# Patient Record
Sex: Female | Born: 1955 | Race: Black or African American | Hispanic: No | Marital: Married | Smoking: Never smoker
Health system: Southern US, Community
[De-identification: ages and names within clinical notes are randomized; demographics above are authoritative.]

## PROBLEM LIST (undated history)

## (undated) DIAGNOSIS — T7840XA Allergy, unspecified, initial encounter: Secondary | ICD-10-CM

## (undated) DIAGNOSIS — G8929 Other chronic pain: Secondary | ICD-10-CM

## (undated) DIAGNOSIS — M545 Low back pain, unspecified: Secondary | ICD-10-CM

## (undated) DIAGNOSIS — M1612 Unilateral primary osteoarthritis, left hip: Secondary | ICD-10-CM

## (undated) DIAGNOSIS — U071 COVID-19: Secondary | ICD-10-CM

## (undated) DIAGNOSIS — F419 Anxiety disorder, unspecified: Secondary | ICD-10-CM

## (undated) DIAGNOSIS — M1990 Unspecified osteoarthritis, unspecified site: Secondary | ICD-10-CM

## (undated) DIAGNOSIS — I10 Essential (primary) hypertension: Secondary | ICD-10-CM

## (undated) HISTORY — DX: Unspecified osteoarthritis, unspecified site: M19.90

## (undated) HISTORY — DX: Allergy, unspecified, initial encounter: T78.40XA

## (undated) HISTORY — DX: Other chronic pain: G89.29

## (undated) HISTORY — DX: COVID-19: U07.1

## (undated) HISTORY — DX: Low back pain, unspecified: M54.50

## (undated) HISTORY — PX: OTHER SURGICAL HISTORY: SHX169

## (undated) HISTORY — DX: Anxiety disorder, unspecified: F41.9

## (undated) HISTORY — DX: Unilateral primary osteoarthritis, left hip: M16.12

## (undated) HISTORY — DX: Low back pain: M54.5

## (undated) HISTORY — DX: Essential (primary) hypertension: I10

---

## 1998-03-21 ENCOUNTER — Other Ambulatory Visit: Admission: RE | Admit: 1998-03-21 | Discharge: 1998-03-21 | Payer: Self-pay | Admitting: Obstetrics and Gynecology

## 1998-07-05 ENCOUNTER — Ambulatory Visit (HOSPITAL_COMMUNITY): Admission: RE | Admit: 1998-07-05 | Discharge: 1998-07-05 | Payer: Self-pay | Admitting: Obstetrics and Gynecology

## 1999-10-08 ENCOUNTER — Emergency Department (HOSPITAL_COMMUNITY): Admission: EM | Admit: 1999-10-08 | Discharge: 1999-10-08 | Payer: Self-pay

## 2013-03-20 ENCOUNTER — Ambulatory Visit (INDEPENDENT_AMBULATORY_CARE_PROVIDER_SITE_OTHER): Payer: 59 | Admitting: Internal Medicine

## 2013-03-20 VITALS — BP 128/60 | HR 68 | Temp 98.4°F | Resp 16 | Ht 66.5 in | Wt 166.0 lb

## 2013-03-20 DIAGNOSIS — R05 Cough: Secondary | ICD-10-CM

## 2013-03-20 DIAGNOSIS — J019 Acute sinusitis, unspecified: Secondary | ICD-10-CM

## 2013-03-20 DIAGNOSIS — R059 Cough, unspecified: Secondary | ICD-10-CM

## 2013-03-20 MED ORDER — HYDROCODONE-HOMATROPINE 5-1.5 MG/5ML PO SYRP: 5.0000 mL | ORAL_SOLUTION | Freq: Four times a day (QID) | ORAL | Status: DC | PRN

## 2013-03-20 MED ORDER — AMOXICILLIN-POT CLAVULANATE 875-125 MG PO TABS: 1.0000 | ORAL_TABLET | Freq: Two times a day (BID) | ORAL | Status: DC

## 2013-03-20 NOTE — Progress Notes (Signed)
This chart was scribed for Tami Lin, MD by Einar Pheasant, ED Scribe. This patient was seen in room 11 and the patient's care was started at 8:40 PM. Subjective:    Patient ID: Runell Gess, female    DOB: 12/29/1955, 58 y.o.   MRN: [id number]  HPI HPI Comments: VALERY AMEDEE is a 58 y.o. female who presents to the [facility] complaining of a sore throat that started 3 days ago. Pt is also complaining of associated headache, cough, chills, rhinorrhea, congestion, and voice changes. She states that she recently regained her normal voice. At night, she states that she is able to breathe. Pt states that she feels like she doesn't feel like doing anything and she is tiered all the time. She reports taking many OTC medications with not relief. Pt reports taking 500mg  of Amoxicillin for 3 days. Denies any fever, SOB, or chest pain.   There are no active problems to display for this patient.  Past Medical History  Diagnosis Date  . Allergy   . Anxiety   . Arthritis    History reviewed. No pertinent past surgical history. Not on File Prior to Admission medications   Not on File   History   Social History  . Marital Status: Married    Spouse Name: N/A    Number of Children: N/A  . Years of Education: N/A   Occupational History  . Not on file.   Social History Main Topics  . Smoking status: Never Smoker   . Smokeless tobacco: Not on file  . Alcohol Use: No  . Drug Use: No  . Sexual Activity: Not on file   Other Topics Concern  . Not on file   Social History Narrative  . No narrative on file     Review of Systems As per HPI and PMH    Objective:   Physical Exam  Nursing note and vitals reviewed. Constitutional: She is oriented to person, place, and time. She appears well-developed and well-nourished. No distress.  HENT:  Head: Normocephalic and atraumatic.  Right Ear: External ear normal.  Left Ear: External ear normal.  Nose:  Nose normal.  Mouth/Throat: Oropharynx is clear and moist. No oropharyngeal exudate.  Nose has purulent discharge.  Eyes: Conjunctivae are normal. Right eye exhibits no discharge. Left eye exhibits no discharge.  Neck: Neck supple. No thyromegaly present.  Cardiovascular: Normal rate, regular rhythm and normal heart sounds.  Exam reveals no gallop and no friction rub.   No murmur heard. Pulmonary/Chest: Effort normal and breath sounds normal. No respiratory distress. She has no wheezes. She has no rales.  Abdominal: Soft. Bowel sounds are normal. She exhibits no distension and no mass. There is no tenderness. There is no rebound.  Musculoskeletal: She exhibits no edema and no tenderness.  Lymphadenopathy:    She has no cervical adenopathy.  Neurological: She is alert and oriented to person, place, and time.  Skin: Skin is warm and dry.  Psychiatric: She has a normal mood and affect. Her behavior is normal. Thought content normal.    Filed Vitals:   03/20/13 2035  BP: 128/60  Pulse: 68  Temp: 98.4 F (36.9 C)  TempSrc: Oral  Resp: 16  Height: 5' 6.5" (1.689 m)  Weight: 166 lb (75.297 kg)  SpO2: 98%          Assessment & Plan:    I have completed the patient encounter in its entirety as documented by the scribe,  with editing by me where necessary. Sulamita Lafountain P. Laney Pastor, M.D. Acute sinusitis, unspecified  Cough  Meds ordered this encounter  Medications  . amoxicillin-clavulanate (AUGMENTIN) 875-125 MG per tablet    Sig: Take 1 tablet by mouth 2 (two) times daily.    Dispense:  20 tablet    Refill:  0  . HYDROcodone-homatropine (HYCODAN) 5-1.5 MG/5ML syrup    Sig: Take 5 mLs by mouth every 6 (six) hours as needed for cough.    Dispense:  120 mL    Refill:  0

## 2013-10-30 ENCOUNTER — Ambulatory Visit (INDEPENDENT_AMBULATORY_CARE_PROVIDER_SITE_OTHER): Payer: 59 | Admitting: Family Medicine

## 2013-10-30 VITALS — BP 174/80 | HR 71 | Temp 98.7°F | Resp 18 | Wt 165.0 lb

## 2013-10-30 DIAGNOSIS — M7122 Synovial cyst of popliteal space [Baker], left knee: Secondary | ICD-10-CM

## 2013-10-30 DIAGNOSIS — J209 Acute bronchitis, unspecified: Secondary | ICD-10-CM

## 2013-10-30 DIAGNOSIS — L239 Allergic contact dermatitis, unspecified cause: Secondary | ICD-10-CM

## 2013-10-30 MED ORDER — TRIAMCINOLONE ACETONIDE 0.1 % EX CREA: 1.0000 "application " | TOPICAL_CREAM | Freq: Two times a day (BID) | CUTANEOUS | Status: DC

## 2013-10-30 MED ORDER — CEPHALEXIN 500 MG PO CAPS: 500.0000 mg | ORAL_CAPSULE | Freq: Four times a day (QID) | ORAL | Status: DC

## 2013-10-30 MED ORDER — MUPIROCIN 2 % EX OINT
1.0000 | TOPICAL_OINTMENT | Freq: Two times a day (BID) | CUTANEOUS | Status: DC
Start: 2013-10-30 — End: 2013-12-12

## 2013-10-30 MED ORDER — HYDROCODONE-HOMATROPINE 5-1.5 MG/5ML PO SYRP: 5.0000 mL | ORAL_SOLUTION | Freq: Four times a day (QID) | ORAL | Status: DC | PRN

## 2013-10-30 NOTE — Progress Notes (Signed)
Subjective:  This chart was scribed for Delman Cheadle, MD, by Starleen Arms, ED Scribe. This patient was seen in room 8 and the patient's care was started at 7:35 PM.   Patient ID: Charlene Cox, female    DOB: 04/20/1955, 58 y.o.   MRN: [id number] Chief Complaint  Patient presents with  . Sore Throat  . Cough  . Hand Pain    history of laceration  . Leg Pain    swelling/pain behind left knee    HPI HPI Comments: Charlene Cox is a 58 y.o. female who presents to the Emergency Department complaining of cough productive of green sputum with associated congestion, generalized fatigue, and sore throat onset 7 days ago.  She reports that the sore throat has resolved but her other symptoms have persisted with some improvement.  Patient reports that she has used multiple OTC medications - most recently today - but cannot recall the names.  Patient denies history of smoking, asthma.  Patient denies weakness, fever, chills, SOB, wheezing, CP.    Patient also complains of a laceration on the dorsal surface of her right thumb that has become mildly swollen and discolored recently.  She has treated with a tea tree oil and neosporin.    Patient also complains of an area of hyperpigmentation and soreness on her posterior left knee joint.   Patient denies having an at-home blood pressure cuff.  She reports that her BP is usually within a normal range, but she has been under a significant amount of stress lately.     Current Outpatient Prescriptions on File Prior to Visit  Medication Sig Dispense Refill  . amoxicillin-clavulanate (AUGMENTIN) 875-125 MG per tablet Take 1 tablet by mouth 2 (two) times daily.  20 tablet  0  . HYDROcodone-homatropine (HYCODAN) 5-1.5 MG/5ML syrup Take 5 mLs by mouth every 6 (six) hours as needed for cough.  120 mL  0   No current facility-administered medications on file prior to visit.   No Known Allergies Past Medical History  Diagnosis Date  . Allergy   .  Anxiety   . Arthritis       Review of Systems  Constitutional: Negative for fever and chills.  HENT: Positive for congestion and sore throat.   Respiratory: Positive for cough. Negative for shortness of breath and wheezing.   Cardiovascular: Negative for chest pain.  Neurological: Negative for weakness.       Objective:  BP 174/80  Pulse 71  Temp(Src) 98.7 F (37.1 C) (Oral)  Resp 18  Wt 165 lb (74.844 kg)  SpO2 98%  Physical Exam  Nursing note and vitals reviewed. Constitutional: She is oriented to person, place, and time. She appears well-developed and well-nourished. No distress.  HENT:  Head: Normocephalic and atraumatic.  Erythema and exudate on the oropharynx but no edema.  TM's normal.  Nasopharynx normal.   Eyes: Conjunctivae and EOM are normal.  Neck: Neck supple. No tracheal deviation present.  Cardiovascular: Normal rate, regular rhythm and normal heart sounds.   Pulmonary/Chest: Effort normal and breath sounds normal. No respiratory distress. She has no wheezes. She has no rales.  Musculoskeletal: Normal range of motion.  Very poorly defined, very subtle hyperpigmented macular area on left popliteal fossa with fullness palpable.  No edema in lower extremity or calf tenderness.  Knee otherwise appears normal.   Neurological: She is alert and oriented to person, place, and time.  Skin: Skin is warm and dry.  Thenar eminence and first phalanx of  right hand palmar aspect had thickened, yellow, cracked, scaling skin approximately 2 in by 3/4 in diameter on erythematous base.  No odor or exudate from crack.   Second small area around 5th MCP on palmar aspect also noted.   Psychiatric: She has a normal mood and affect. Her behavior is normal.          Assessment & Plan:  7:43 PM Discussed plan to prescribe oral antibiotic as well as additional topical antibiotic for left thumb.  Informed patient that a cyst is causing the discomfort in her left knee.  Acute  bronchitis, unspecified organism  Atopic contact dermatitis with secondary bacterial infection - start keflex and alternate topical triamcinolone w/ mupirocin  Baker cyst, left - RICE - if develops pain or worsens, rec [place]  Meds ordered this encounter  Medications  . cephALEXin (KEFLEX) 500 MG capsule    Sig: Take 1 capsule (500 mg total) by mouth 4 (four) times daily.    Dispense:  28 capsule    Refill:  0  . HYDROcodone-homatropine (HYCODAN) 5-1.5 MG/5ML syrup    Sig: Take 5 mLs by mouth every 6 (six) hours as needed for cough.    Dispense:  120 mL    Refill:  0  . mupirocin ointment (BACTROBAN) 2 %    Sig: Apply 1 application topically 2 (two) times daily.    Dispense:  30 g    Refill:  0  . triamcinolone cream (KENALOG) 0.1 %    Sig: Apply 1 application topically 2 (two) times daily.    Dispense:  30 g    Refill:  0  . DISCONTD: Alum & Mag Hydroxide-Simeth (MAGIC MOUTHWASH W/LIDOCAINE) SOLN    Sig: Take 10 mLs by mouth every 2 (two) hours as needed for mouth pain.    Dispense:  360 mL    Refill:  0    Ok to use pharmacy formulary; mix in 1:1 ratio with viscous lidocaine, swish and spit  . Alum & Mag Hydroxide-Simeth (MAGIC MOUTHWASH W/LIDOCAINE) SOLN    Sig: Take 10 mLs by mouth every 2 (two) hours as needed for mouth pain.    Dispense:  360 mL    Refill:  0    Ok to use pharmacy formulary; mix in 1:1 ratio with viscous lidocaine, swish and spit    I personally performed the services described in this documentation, which was scribed in my presence. The recorded information has been reviewed and considered, and addended by me as needed.  Delman Cheadle, MD MPH

## 2013-10-30 NOTE — Patient Instructions (Signed)
Alternate the mupirocin (topical antibiotic) with the triamcinolone to your hand (steroid) General skin care measures aimed at reducing skin irritation and restoring the skin barrier include:  Using lukewarm water and soap-free cleansers to wash hands  Drying hands thoroughly after washing  Applying emollients (eg, petroleum jelly) immediately after hand drying and as often as possible  Wearing cotton gloves under vinyl or other nonlatex gloves when performing wet work  Removing rings and watches and bracelets before wet work  Wearing protective gloves in cold weather  Wearing task-specific gloves for frictional exposures (eg, gardening, carpentry)  Avoiding exposure to irritants (eg, detergents, solvents, hair lotions or dyes, acidic foods [eg, citrus fruit])     Acute Bronchitis Bronchitis is inflammation of the airways that extend from the windpipe into the lungs (bronchi). The inflammation often causes mucus to develop. This leads to a cough, which is the most common symptom of bronchitis.  In acute bronchitis, the condition usually develops suddenly and goes away over time, usually in a couple weeks. Smoking, allergies, and asthma can make bronchitis worse. Repeated episodes of bronchitis may cause further lung problems.  CAUSES Acute bronchitis is most often caused by the same virus that causes a cold. The virus can spread from person to person (contagious) through coughing, sneezing, and touching contaminated objects. SIGNS AND SYMPTOMS   Cough.   Fever.   Coughing up mucus.   Body aches.   Chest congestion.   Chills.   Shortness of breath.   Sore throat.  DIAGNOSIS  Acute bronchitis is usually diagnosed through a physical exam. Your health care provider will also ask you questions about your medical history. Tests, such as chest X-rays, are sometimes done to rule out other conditions.  TREATMENT  Acute bronchitis usually goes away in a couple weeks. Oftentimes,  no medical treatment is necessary. Medicines are sometimes given for relief of fever or cough. Antibiotic medicines are usually not needed but may be prescribed in certain situations. In some cases, an inhaler may be recommended to help reduce shortness of breath and control the cough. A cool mist vaporizer may also be used to help thin bronchial secretions and make it easier to clear the chest.  HOME CARE INSTRUCTIONS  Get plenty of rest.   Drink enough fluids to keep your urine clear or pale yellow (unless you have a medical condition that requires fluid restriction). Increasing fluids may help thin your respiratory secretions (sputum) and reduce chest congestion, and it will prevent dehydration.   Take medicines only as directed by your health care provider.  If you were prescribed an antibiotic medicine, finish it all even if you start to feel better.  Avoid smoking and secondhand smoke. Exposure to cigarette smoke or irritating chemicals will make bronchitis worse. If you are a smoker, consider using nicotine gum or skin patches to help control withdrawal symptoms. Quitting smoking will help your lungs heal faster.   Reduce the chances of another bout of acute bronchitis by washing your hands frequently, avoiding people with cold symptoms, and trying not to touch your hands to your mouth, nose, or eyes.   Keep all follow-up visits as directed by your health care provider.  SEEK MEDICAL CARE IF: Your symptoms do not improve after 1 week of treatment.  SEEK IMMEDIATE MEDICAL CARE IF:  You develop an increased fever or chills.   You have chest pain.   You have severe shortness of breath.  You have bloody sputum.   You develop dehydration.  You faint or repeatedly feel like you are going to pass out.  You develop repeated vomiting.  You develop a severe headache. MAKE SURE YOU:   Understand these instructions.  Will watch your condition.  Will get help right away if  you are not doing well or get worse. Document Released: 02/06/2004 Document Revised: 05/15/2013 Document Reviewed: 06/21/2012 [facility] Patient Information 2015 [place], [place]. This information is not intended to replace advice given to you by your health care provider. Make sure you discuss any questions you have with your health care provider. Baker Cyst A Baker cyst is a sac-like structure that forms in the back of the knee. It is filled with the same fluid that is located in your knee. This fluid lubricates the bones and cartilage of the knee and allows them to move over each other more easily. CAUSES  When the knee becomes injured or inflamed, increased fluid forms in the knee. When this happens, the joint lining is pushed out behind the knee and forms the Baker cyst. This cyst may also be caused by inflammation from arthritic conditions and infections. SIGNS AND SYMPTOMS  A Baker cyst usually has no symptoms. When the cyst is substantially enlarged:  You may feel pressure behind the knee, stiffness in the knee, or a mass in the area behind the knee.  You may develop pain, redness, and swelling in the calf. This can suggest a blood clot and requires evaluation by your health care provider. DIAGNOSIS  A Baker cyst is most often found during an ultrasound exam. This exam may have been performed for other reasons, and the cyst was found incidentally. Sometimes an MRI is used. This picks up other problems within a joint that an ultrasound exam may not. If the Baker cyst developed immediately after an injury, X-ray exams may be used to diagnose the cyst. TREATMENT  The treatment depends on the cause of the cyst. Anti-inflammatory medicines and rest often will be prescribed. If the cyst is caused by a bacterial infection, antibiotic medicines may be prescribed.  HOME CARE INSTRUCTIONS   If the cyst was caused by an injury, for the first 24 hours, keep the injured leg elevated on 2 pillows while  lying down.  For the first 24 hours while you are awake, apply ice to the injured area:  Put ice in a plastic bag.  Place a towel between your skin and the bag.  Leave the ice on for 20 minutes, 2-3 times a day.  Only take over-the-counter or prescription medicines for pain, discomfort, or fever as directed by your health care provider.  Only take antibiotic medicine as directed. Make sure to finish it even if you start to feel better. MAKE SURE YOU:   Understand these instructions.  Will watch your condition.  Will get help right away if you are not doing well or get worse. Document Released: 12/29/2004 Document Revised: 10/19/2012 Document Reviewed: 08/10/2012 [facility] Patient Information 2015 [place], [place]. This information is not intended to replace advice given to you by your health care provider. Make sure you discuss any questions you have with your health care provider.

## 2013-11-03 ENCOUNTER — Telehealth: Payer: Self-pay

## 2013-11-03 MED ORDER — MAGIC MOUTHWASH W/LIDOCAINE: 10.0000 mL | ORAL | Status: DC | PRN

## 2013-11-03 NOTE — Telephone Encounter (Signed)
Was sent in to [facility] and normally they will go ahead and fill it - please call [facility] and cancel and then ok to resend rx in to [place].  Could be trouble if [facility] already filled it and then if they charge her insurance sometimes the second place won't fill it or charge her more. . . .

## 2013-11-03 NOTE — Telephone Encounter (Signed)
cxl'd rx at [facility]

## 2013-11-03 NOTE — Telephone Encounter (Signed)
Patient calling again to get the magic mouthwash called into her pharamcy [facility]. Closer to her home. Please do so.   She can be reached at [phone].

## 2013-11-03 NOTE — Telephone Encounter (Signed)
Dr. Brigitte Pulse:  Patient saw you Monday and since then her throat is still pretty sore.  Can we call her in some prescription lozenges or medicine to [facility] on [address].  She can be reached at [phone].

## 2013-11-09 ENCOUNTER — Telehealth: Payer: Self-pay

## 2013-11-09 NOTE — Telephone Encounter (Signed)
Patient called again requesting refill on the cough syrup. Please return call and advise. States she still has a cough.

## 2013-11-09 NOTE — Telephone Encounter (Signed)
Patient would like another round of HYDROcodone-homatropine (HYCODAN) 5-1.5 MG/5ML syrup Still has the cough.  Saw Dr. Brigitte Pulse Changing pharmacy to [facility]    [phone]

## 2013-11-10 MED ORDER — HYDROCODONE-HOMATROPINE 5-1.5 MG/5ML PO SYRP: 5.0000 mL | ORAL_SOLUTION | Freq: Three times a day (TID) | ORAL | Status: DC | PRN

## 2013-11-10 NOTE — Telephone Encounter (Signed)
Refilled x 1 -rx has to be picked up since controlled - needs OV if cough continues

## 2013-11-11 NOTE — Telephone Encounter (Signed)
I am unable to find this prescription. Do you know where it is?

## 2013-11-11 NOTE — Telephone Encounter (Signed)
It printed out at [facility] and I asked the staff to have someone bring it over to [facility] for pick up. If it can't be located, please ask PA to refill. thx

## 2013-11-12 NOTE — Telephone Encounter (Signed)
Reprinted Rx.

## 2013-11-13 NOTE — Telephone Encounter (Signed)
Script is at [facility] to be picked up.

## 2013-11-13 NOTE — Telephone Encounter (Signed)
Lm to advise pt. 

## 2013-12-12 ENCOUNTER — Ambulatory Visit (INDEPENDENT_AMBULATORY_CARE_PROVIDER_SITE_OTHER): Payer: 59 | Admitting: Sports Medicine

## 2013-12-12 VITALS — BP 152/90 | HR 67 | Temp 98.1°F | Resp 16 | Ht 66.5 in | Wt 164.1 lb

## 2013-12-12 DIAGNOSIS — R030 Elevated blood-pressure reading, without diagnosis of hypertension: Secondary | ICD-10-CM

## 2013-12-12 DIAGNOSIS — IMO0001 Reserved for inherently not codable concepts without codable children: Secondary | ICD-10-CM

## 2013-12-12 DIAGNOSIS — B350 Tinea barbae and tinea capitis: Secondary | ICD-10-CM

## 2013-12-12 MED ORDER — SELENIUM SULFIDE 2.25 % EX SHAM: 1.0000 "application " | MEDICATED_SHAMPOO | Freq: Every day | CUTANEOUS | Status: DC

## 2013-12-12 MED ORDER — SELENIUM SULFIDE 2.25 % EX SHAM
1.0000 | MEDICATED_SHAMPOO | Freq: Every day | CUTANEOUS | Status: DC
Start: 2013-12-12 — End: 2015-11-04

## 2013-12-12 NOTE — Patient Instructions (Signed)

## 2013-12-12 NOTE — Progress Notes (Signed)
  Charlene Cox - 58 y.o. female MRN [id number]  Date of birth: [birth year]  CC & HPI:  Charlene Cox is here for evaluation of: Scalp rash: Patient reports rash over entire occipital region of her scalp over the past month. She reports this appeared following changing barbers and using new hair product. She has had itching and has been excoriating the region. She had a lump appear on the posterior aspect of her neck yesterday and reported it to be approximately the size of a golf ball. It has decreased in size after taking a leftover 20 mg prednisone yesterday. She denies any fevers, chills, night sweats. No recent weight loss. She's not been using other topical medications or any other interventions at this time.   ROS:  Per HPI.   HISTORY: Past Medical, Surgical, Social, and Family History Reviewed & Updated per EMR.  Pertinent Historical Findings include: Otherwise Healthy  Non Smoker No chronic medications  OBJECTIVE:  VS:   HT:5' 6.5" (168.9 cm)   WT:164 lb 2 oz (74.447 kg)  BMI:26.1          BP:(!) 152/90 mmHg  HR:67bpm  TEMP:98.1 F (36.7 C)(Oral)  RESP:100 %  PHYSICAL EXAM: GENERAL:  adult African-American female. In no discomfort; no respiratory distress   PSYCH: alert and appropriate, good insight   HNEENT: mmm, no JVD Significant satellite lesions spread throughout the scalp with secondary excoriations but no surrounding erythema or loculations. There is a 1 cm mobile, mildly tender lymph node in the left posterior occipital region. No anterior or posterior cervical chain lymphadenopathy.   Skin:  she has lesions over the right thenar eminence as well as the left antebrachial crease that is consistent with tinea corpis  EXTREM: Warm, well perfused.  Moves all 4 extremities spontaneously; no lateralization. No pretibial edema.   ASSESSMENT: 1. Tinea capitis   2. Elevated blood pressure    Likely secondary to exposure at [place]. She does have other skin lesions consistent  with tinea infection does not appear to be significantly deep and will try to avoid systemic therapy at this time this may be warranted if not improving.  PLAN: See problem based charting & AVS for additional documentation.  Topical slam sulfide daily for 1 week. If not improved may need systemic therapy. I would not be surprised if she also had an acute flair in the next 1-2 days due to the steroid use but hopefully this will improve and she will recover uneventfully.   Avoid further steroids at this time.  Continue to monitor blood pressure, patient took 20 mg tablet of prednisone yesterday which may be contributing. Needs to be followed > Return if symptoms worsen or fail to improve.

## 2015-08-30 ENCOUNTER — Ambulatory Visit
Admission: RE | Admit: 2015-08-30 | Discharge: 2015-08-30 | Disposition: A | Discharge status: Home / Self Care | Payer: BLUE CROSS/BLUE SHIELD | Source: Ambulatory Visit | Attending: Internal Medicine | Admitting: Internal Medicine

## 2015-08-30 ENCOUNTER — Other Ambulatory Visit: Payer: Self-pay | Admitting: Internal Medicine

## 2015-08-30 DIAGNOSIS — Z1231 Encounter for screening mammogram for malignant neoplasm of breast: Secondary | ICD-10-CM

## 2015-11-04 ENCOUNTER — Ambulatory Visit (INDEPENDENT_AMBULATORY_CARE_PROVIDER_SITE_OTHER): Payer: BLUE CROSS/BLUE SHIELD | Admitting: Urgent Care

## 2015-11-04 ENCOUNTER — Ambulatory Visit (INDEPENDENT_AMBULATORY_CARE_PROVIDER_SITE_OTHER): Payer: BLUE CROSS/BLUE SHIELD

## 2015-11-04 VITALS — BP 148/96 | HR 84 | Temp 98.5°F | Resp 17 | Ht 66.5 in | Wt 180.0 lb

## 2015-11-04 DIAGNOSIS — R0989 Other specified symptoms and signs involving the circulatory and respiratory systems: Secondary | ICD-10-CM

## 2015-11-04 DIAGNOSIS — J302 Other seasonal allergic rhinitis: Secondary | ICD-10-CM | POA: Diagnosis not present

## 2015-11-04 DIAGNOSIS — R059 Cough, unspecified: Secondary | ICD-10-CM

## 2015-11-04 DIAGNOSIS — J029 Acute pharyngitis, unspecified: Secondary | ICD-10-CM

## 2015-11-04 DIAGNOSIS — R0981 Nasal congestion: Secondary | ICD-10-CM | POA: Diagnosis not present

## 2015-11-04 DIAGNOSIS — R05 Cough: Secondary | ICD-10-CM

## 2015-11-04 DIAGNOSIS — R457 State of emotional shock and stress, unspecified: Secondary | ICD-10-CM

## 2015-11-04 DIAGNOSIS — I10 Essential (primary) hypertension: Secondary | ICD-10-CM | POA: Diagnosis not present

## 2015-11-04 DIAGNOSIS — F329 Major depressive disorder, single episode, unspecified: Secondary | ICD-10-CM | POA: Diagnosis not present

## 2015-11-04 DIAGNOSIS — F32A Depression, unspecified: Secondary | ICD-10-CM

## 2015-11-04 MED ORDER — LISINOPRIL-HYDROCHLOROTHIAZIDE 10-12.5 MG PO TABS: 1.0000 | ORAL_TABLET | Freq: Every day | ORAL | 3 refills | Status: DC

## 2015-11-04 MED ORDER — CETIRIZINE HCL 10 MG PO TABS: 10.0000 mg | ORAL_TABLET | Freq: Every day | ORAL | 11 refills | Status: DC

## 2015-11-04 MED ORDER — METHYLPREDNISOLONE ACETATE 80 MG/ML IJ SUSP
80.0000 mg | Freq: Once | INTRAMUSCULAR | Status: AC
  Administered 2015-11-04: 80 mg via INTRAMUSCULAR

## 2015-11-04 MED ORDER — FLUTICASONE PROPIONATE 50 MCG/ACT NA SUSP: 2.0000 | Freq: Every day | NASAL | 11 refills | Status: DC

## 2015-11-04 MED ORDER — MONTELUKAST SODIUM 10 MG PO TABS: 10.0000 mg | ORAL_TABLET | Freq: Every day | ORAL | 3 refills | Status: DC

## 2015-11-04 MED ORDER — FLUOXETINE HCL 20 MG PO TABS: 20.0000 mg | ORAL_TABLET | Freq: Every day | ORAL | 3 refills | Status: DC

## 2015-11-04 NOTE — Patient Instructions (Addendum)
Fluoxetine capsules or tablets (Depression/Mood Disorders) What is this medicine? FLUOXETINE (floo OX e teen) belongs to a class of drugs known as selective serotonin reuptake inhibitors (SSRIs). It helps to treat mood problems such as depression, obsessive compulsive disorder, and panic attacks. It can also treat certain eating disorders. This medicine may be used for other purposes; ask your health care provider or pharmacist if you have questions. What should I tell my health care provider before I take this medicine? They need to know if you have any of these conditions: -bipolar disorder or mania -diabetes -glaucoma -liver disease -psychosis -seizures -suicidal thoughts or history of attempted suicide -an unusual or allergic reaction to fluoxetine, other medicines, foods, dyes, or preservatives -pregnant or trying to get pregnant -breast-feeding How should I use this medicine? Take this medicine by mouth with a glass of water. Follow the directions on the prescription label. You can take this medicine with or without food. Take your medicine at regular intervals. Do not take it more often than directed. Do not stop taking this medicine suddenly except upon the advice of your doctor. Stopping this medicine too quickly may cause serious side effects or your condition may worsen. A special MedGuide will be given to you by the pharmacist with each prescription and refill. Be sure to read this information carefully each time. Talk to your pediatrician regarding the use of this medicine in children. While this drug may be prescribed for children as young as 7 years for selected conditions, precautions do apply. Overdosage: If you think you have taken too much of this medicine contact a poison control center or emergency room at once. NOTE: This medicine is only for you. Do not share this medicine with others. What if I miss a dose? If you miss a dose, skip the missed dose and go back to your  regular dosing schedule. Do not take double or extra doses. What may interact with this medicine? Do not take fluoxetine with any of the following medications: -other medicines containing fluoxetine, like Sarafem or Symbyax -cisapride -linezolid -MAOIs like Carbex, Eldepryl, Marplan, Nardil, and Parnate -methylene blue (injected into a vein) -pimozide -thioridazine This medicine may also interact with the following medications: -alcohol -aspirin and aspirin-like medicines -carbamazepine -certain medicines for depression, anxiety, or psychotic disturbances -certain medicines for migraine headaches like almotriptan, eletriptan, frovatriptan, naratriptan, rizatriptan, sumatriptan, zolmitriptan -digoxin -diuretics -fentanyl -flecainide -furazolidone -isoniazid -lithium -medicines for sleep -medicines that treat or prevent blood clots like warfarin, enoxaparin, and dalteparin -NSAIDs, medicines for pain and inflammation, like ibuprofen or naproxen -phenytoin -procarbazine -propafenone -rasagiline -ritonavir -supplements like St. John's wort, kava kava, valerian -tramadol -tryptophan -vinblastine This list may not describe all possible interactions. Give your health care provider a list of all the medicines, herbs, non-prescription drugs, or dietary supplements you use. Also tell them if you smoke, drink alcohol, or use illegal drugs. Some items may interact with your medicine. What should I watch for while using this medicine? Tell your doctor if your symptoms do not get better or if they get worse. Visit your doctor or health care professional for regular checks on your progress. Because it may take several weeks to see the full effects of this medicine, it is important to continue your treatment as prescribed by your doctor. Patients and their families should watch out for new or worsening thoughts of suicide or depression. Also watch out for sudden changes in feelings such as  feeling anxious, agitated, panicky, irritable, hostile, aggressive, impulsive, severely restless,   overly excited and hyperactive, or not being able to sleep. If this happens, especially at the beginning of treatment or after a change in dose, call your health care professional. Dennis Bast may get drowsy or dizzy. Do not drive, use machinery, or do anything that needs mental alertness until you know how this medicine affects you. Do not stand or sit up quickly, especially if you are an older patient. This reduces the risk of dizzy or fainting spells. Alcohol may interfere with the effect of this medicine. Avoid alcoholic drinks. Your mouth may get dry. Chewing sugarless gum or sucking hard candy, and drinking plenty of water may help. Contact your doctor if the problem does not go away or is severe. This medicine may affect blood sugar levels. If you have diabetes, check with your doctor or health care professional before you change your diet or the dose of your diabetic medicine. What side effects may I notice from receiving this medicine? Side effects that you should report to your doctor or health care professional as soon as possible: -allergic reactions like skin rash, itching or hives, swelling of the face, lips, or tongue -breathing problems -confusion -eye pain, changes in vision -fast or irregular heart rate, palpitations -flu-like fever, chills, cough, muscle or joint aches and pains -seizures -suicidal thoughts or other mood changes -swelling or redness in or around the eye -tremors -trouble sleeping -unusual bleeding or bruising -unusually tired or weak -vomiting Side effects that usually do not require medical attention (report to your doctor or health care professional if they continue or are bothersome): -change in sex drive or performance -diarrhea -dry mouth -flushing -headache -increased or decreased appetite -nausea -sweating This list may not describe all possible side  effects. Call your doctor for medical advice about side effects. You may report side effects to FDA at 1-800-FDA-1088. Where should I keep my medicine? Keep out of the reach of children. Store at room temperature between 15 and 30 degrees C (59 and 86 degrees F). Throw away any unused medicine after the expiration date. NOTE: This sheet is a summary. It may not cover all possible information. If you have questions about this medicine, talk to your doctor, pharmacist, or health care provider.    2016, Elsevier/Gold Standard. (2013-12-22 12:40:07)   Cough, Adult Coughing is a reflex that clears your throat and your airways. Coughing helps to heal and protect your lungs. It is normal to cough occasionally, but a cough that happens with other symptoms or lasts a long time may be a sign of a condition that needs treatment. A cough may last only 2-3 weeks (acute), or it may last longer than 8 weeks (chronic). CAUSES Coughing is commonly caused by:  Breathing in substances that irritate your lungs.  A viral or bacterial respiratory infection.  Allergies.  Asthma.  Postnasal drip.  Smoking.  Acid backing up from the stomach into the esophagus (gastroesophageal reflux).  Certain medicines.  Chronic lung problems, including COPD (or rarely, lung cancer).  Other medical conditions such as heart failure. HOME CARE INSTRUCTIONS  Pay attention to any changes in your symptoms. Take these actions to help with your discomfort:  Take medicines only as told by your health care provider.  If you were prescribed an antibiotic medicine, take it as told by your health care provider. Do not stop taking the antibiotic even if you start to feel better.  Talk with your health care provider before you take a cough suppressant medicine.  Drink enough fluid  to keep your urine clear or pale yellow.  If the air is dry, use a cold steam vaporizer or humidifier in your bedroom or your home to help loosen  secretions.  Avoid anything that causes you to cough at work or at home.  If your cough is worse at night, try sleeping in a semi-upright position.  Avoid cigarette smoke. If you smoke, quit smoking. If you need help quitting, ask your health care provider.  Avoid caffeine.  Avoid alcohol.  Rest as needed. SEEK MEDICAL CARE IF:   You have new symptoms.  You cough up pus.  Your cough does not get better after 2-3 weeks, or your cough gets worse.  You cannot control your cough with suppressant medicines and you are losing sleep.  You develop pain that is getting worse or pain that is not controlled with pain medicines.  You have a fever.  You have unexplained weight loss.  You have night sweats. SEEK IMMEDIATE MEDICAL CARE IF:  You cough up blood.  You have difficulty breathing.  Your heartbeat is very fast.   This information is not intended to replace advice given to you by your health care provider. Make sure you discuss any questions you have with your health care provider.   Document Released: 06/27/2010 Document Revised: 09/19/2014 Document Reviewed: 03/07/2014 Elsevier Interactive Patient Education 2016 [employer].     Hypertension Hypertension, commonly called high blood pressure, is when the force of blood pumping through your arteries is too strong. Your arteries are the blood vessels that carry blood from your heart throughout your body. A blood pressure reading consists of a higher number over a lower number, such as 110/72. The higher number (systolic) is the pressure inside your arteries when your heart pumps. The lower number (diastolic) is the pressure inside your arteries when your heart relaxes. Ideally you want your blood pressure below 120/80. Hypertension forces your heart to work harder to pump blood. Your arteries may become narrow or stiff. Having untreated or uncontrolled hypertension can cause heart attack, stroke, kidney disease, and other  problems. RISK FACTORS Some risk factors for high blood pressure are controllable. Others are not.  Risk factors you cannot control include:   Race. You may be at higher risk if you are African American.  Age. Risk increases with age.  Gender. Men are at higher risk than women before age 66 years. After age 95, women are at higher risk than men. Risk factors you can control include:  Not getting enough exercise or physical activity.  Being overweight.  Getting too much fat, sugar, calories, or salt in your diet.  Drinking too much alcohol. SIGNS AND SYMPTOMS Hypertension does not usually cause signs or symptoms. Extremely high blood pressure (hypertensive crisis) may cause headache, anxiety, shortness of breath, and nosebleed. DIAGNOSIS To check if you have hypertension, your health care provider will measure your blood pressure while you are seated, with your arm held at the level of your heart. It should be measured at least twice using the same arm. Certain conditions can cause a difference in blood pressure between your right and left arms. A blood pressure reading that is higher than normal on one occasion does not mean that you need treatment. If it is not clear whether you have high blood pressure, you may be asked to return on a different day to have your blood pressure checked again. Or, you may be asked to monitor your blood pressure at home for 1  or more weeks. TREATMENT Treating high blood pressure includes making lifestyle changes and possibly taking medicine. Living a healthy lifestyle can help lower high blood pressure. You may need to change some of your habits. Lifestyle changes may include:  Following the DASH diet. This diet is high in fruits, vegetables, and whole grains. It is low in salt, red meat, and added sugars.  Keep your sodium intake below 2,300 mg per day.  Getting at least 30-45 minutes of aerobic exercise at least 4 times per week.  Losing weight if  necessary.  Not smoking.  Limiting alcoholic beverages.  Learning ways to reduce stress. Your health care provider may prescribe medicine if lifestyle changes are not enough to get your blood pressure under control, and if one of the following is true:  You are 65-46 years of age and your systolic blood pressure is above 140.  You are 47 years of age or older, and your systolic blood pressure is above 150.  Your diastolic blood pressure is above 90.  You have diabetes, and your systolic blood pressure is over [id number] or your diastolic blood pressure is over 90.  You have kidney disease and your blood pressure is above 140/90.  You have heart disease and your blood pressure is above 140/90. Your personal target blood pressure may vary depending on your medical conditions, your age, and other factors. HOME CARE INSTRUCTIONS  Have your blood pressure rechecked as directed by your health care provider.   Take medicines only as directed by your health care provider. Follow the directions carefully. Blood pressure medicines must be taken as prescribed. The medicine does not work as well when you skip doses. Skipping doses also puts you at risk for problems.  Do not smoke.   Monitor your blood pressure at home as directed by your health care provider. SEEK MEDICAL CARE IF:   You think you are having a reaction to medicines taken.  You have recurrent headaches or feel dizzy.  You have swelling in your ankles.  You have trouble with your vision. SEEK IMMEDIATE MEDICAL CARE IF:  You develop a severe headache or confusion.  You have unusual weakness, numbness, or feel faint.  You have severe chest or abdominal pain.  You vomit repeatedly.  You have trouble breathing. MAKE SURE YOU:   Understand these instructions.  Will watch your condition.  Will get help right away if you are not doing well or get worse.   This information is not intended to replace advice given to you  by your health care provider. Make sure you discuss any questions you have with your health care provider.   Document Released: 12/29/2004 Document Revised: 05/15/2014 Document Reviewed: 10/21/2012 Elsevier Interactive Patient Education 2016 Elsevier Inc.    Allergic Rhinitis Allergic rhinitis is when the mucous membranes in the nose respond to allergens. Allergens are particles in the air that cause your body to have an allergic reaction. This causes you to release allergic antibodies. Through a chain of events, these eventually cause you to release histamine into the blood stream. Although meant to protect the body, it is this release of histamine that causes your discomfort, such as frequent sneezing, congestion, and an itchy, runny nose.  CAUSES Seasonal allergic rhinitis (hay fever) is caused by pollen allergens that may come from grasses, trees, and weeds. Year-round allergic rhinitis (perennial allergic rhinitis) is caused by allergens such as house dust mites, pet dander, and mold spores. SYMPTOMS  Nasal stuffiness (congestion).  Itchy,  runny nose with sneezing and tearing of the eyes. DIAGNOSIS Your health care provider can help you determine the allergen or allergens that trigger your symptoms. If you and your health care provider are unable to determine the allergen, skin or blood testing may be used. Your health care provider will diagnose your condition after taking your health history and performing a physical exam. Your health care provider may assess you for other related conditions, such as asthma, pink eye, or an ear infection. TREATMENT Allergic rhinitis does not have a cure, but it can be controlled by:  Medicines that block allergy symptoms. These may include allergy shots, nasal sprays, and oral antihistamines.  Avoiding the allergen. Hay fever may often be treated with antihistamines in pill or nasal spray forms. Antihistamines block the effects of histamine. There are  over-the-counter medicines that may help with nasal congestion and swelling around the eyes. Check with your health care provider before taking or giving this medicine. If avoiding the allergen or the medicine prescribed do not work, there are many new medicines your health care provider can prescribe. Stronger medicine may be used if initial measures are ineffective. Desensitizing injections can be used if medicine and avoidance does not work. Desensitization is when a patient is given ongoing shots until the body becomes less sensitive to the allergen. Make sure you follow up with your health care provider if problems continue. HOME CARE INSTRUCTIONS It is not possible to completely avoid allergens, but you can reduce your symptoms by taking steps to limit your exposure to them. It helps to know exactly what you are allergic to so that you can avoid your specific triggers. SEEK MEDICAL CARE IF:  You have a fever.  You develop a cough that does not stop easily (persistent).  You have shortness of breath.  You start wheezing.  Symptoms interfere with normal daily activities.   This information is not intended to replace advice given to you by your health care provider. Make sure you discuss any questions you have with your health care provider.   Document Released: 09/23/2000 Document Revised: 01/19/2014 Document Reviewed: 09/05/2012 Elsevier Interactive Patient Education 2016 [employer].     IF you received an x-ray today, you will receive an invoice from [facility]. Please contact [facility] at [phone] with questions or concerns regarding your invoice.   IF you received labwork today, you will receive an invoice from [employer]. Please contact [facility] at [phone] with questions or concerns regarding your invoice.   Our billing staff will not be able to assist you with questions regarding bills from these companies.  You will  be contacted with the lab results as soon as they are available. The fastest way to get your results is to activate your My Chart account. Instructions are located on the last page of this paperwork. If you have not heard from [place] regarding the results in 2 weeks, please contact this office.

## 2015-11-04 NOTE — Progress Notes (Addendum)
Patient ID: Charlene Cox, female   DOB: September 04, 1955, 60 y.o.   MRN: [id number]   By signing my name below, I, Essence Howell, attest that this documentation has been prepared under the direction and in the presence of Jaynee Eagles, PA-C Electronically Signed: Ladene Artist, ED Scribe 11/04/2015 at 4:15 PM.  MRN: [id number] DOB: 10/01/1955  Subjective:   Charlene Cox is a 60 y.o. female presenting for chief complaint of allergy symptoms first noticed 2 days ago. Pt states that symptoms first started as a sore throat 2 days ago. She reports associated symptoms of hoarseness, nasal congestion, HA, ear fullness, coughing spells, wheezing. Pt has used her Albuterol inhaler 3 times over the past 2 days, Nasacort, Delsym and previously prescribed Amoxicillin. She denies fever, chest pain, nausea, vomiting, abdominal pain, hematuria, lower leg swelling. Pt is a nonsmoker. Pt's triage BP: 148/96. She states that her BP has always been elevated; reporting systolic readings in the [id number]. Pt states that she has been the primary caregiver for her husband who has been battling prostate CA since 2008 and helping take care of her goddaughter's 62 y.o twins who is currently serving in the [employer]. She states that she hardly spends any time on herself but her goal is to start putting herself first. She denies HI, SI but admits feeling overwhelmed at times, has passive thoughts about death. She actively admits that she would not hurt herself. Has heard about anti-depressive medications and is agreeable to starting one.  [name] is not currently taking any medications.   Also has No Known Allergies.  [name]  has a past medical history of Allergy; Anxiety; and Arthritis. Also  has no past surgical history on file.  Objective:   Vitals: BP (!) 148/96 (BP Location: Right Arm, Patient Position: Sitting, Cuff Size: Large)    Pulse 84    Temp 98.5 F (36.9 C) (Oral)    Resp 17    Ht 5' 6.5" (1.689 m)    Wt 180 lb (81.6  kg)    SpO2 97%    BMI 28.62 kg/m    BP Readings from Last 3 Encounters:  11/04/15 (!) 148/96  12/12/13 (!) 152/90  10/30/13 (!) 174/80   Physical Exam  Constitutional: She is oriented to person, place, and time. She appears well-developed and well-nourished.  HENT:  Right Ear: Tympanic membrane is not erythematous.  Left Ear: Tympanic membrane is not erythematous.  TMs flat bilaterally but intact bilaterally.  Nasal turbinates pink and moist. Minimal post-nasal drainage.   Cardiovascular: Normal rate, regular rhythm and intact distal pulses.  Exam reveals no gallop and no friction rub.   No murmur heard. Pulmonary/Chest: No respiratory distress. She has no wheezes. She has no rales.  Coarse lung sounds throughout.   Abdominal: Soft. Bowel sounds are normal. She exhibits no mass. There is no tenderness. There is no guarding.  Musculoskeletal: She exhibits no edema.  Neurological: She is alert and oriented to person, place, and time.   Dg Chest 2 View  Result Date: 11/04/2015 CLINICAL DATA:  Cough. EXAM: CHEST  2 VIEW COMPARISON:  03/29/2015 . FINDINGS: Mediastinum and hilar structures are normal. Lungs are clear. No pleural effusion or pneumothorax. Heart size normal. No acute bony abnormality identified. IMPRESSION: No acute cardiopulmonary disease. Electronically Signed   By: Marcello Moores  Register   On: 11/04/2015 17:12   Assessment and Plan :   1. Essential hypertension - Uncontrolled, counseled on diagnosis. Patient is agreeable to starting lis-HCTZ. She is to hold  furosemide which she admits taking occasionally. She has had very inconsistent follow up with her previous PCP but agreed to start more consistent f/u for her BP, will rtc in 4 weeks for BP recheck.  2. Cough 3. Abnormal lung sounds 4. Sinus congestion 5. Sore throat 6. Acute seasonal allergic rhinitis due to other allergen - Will manage allergies aggressively. Start Zyrtec, Singulair and Flonase daily through the end  of November 2017. Depo Medrol today. Call clinic if cough persists, okay to prescribe cough syrup. Lung sounds worrisome, discussed differential, referral to pulmonology or chest CT. Patient would prefer to wait. Will f/u as above.  7. Depression, unspecified depression type 8. Emotional stress - Start Prozac, discussed potential for adverse effects. Patient will f/u for this in 8 weeks.  Jaynee Eagles, PA-C [facility] [phone] 11/04/2015 4:15 PM

## 2015-11-05 ENCOUNTER — Telehealth: Payer: Self-pay

## 2015-11-05 ENCOUNTER — Other Ambulatory Visit: Payer: Self-pay

## 2015-11-05 MED ORDER — FLUOXETINE HCL 20 MG PO CAPS: 20.0000 mg | ORAL_CAPSULE | Freq: Every day | ORAL | 3 refills | Status: DC

## 2015-11-05 NOTE — Telephone Encounter (Signed)
PT req. rx to change from pill to capsule for cost savings// pt. Is at point of sale now.  [place]  [phone]

## 2015-11-05 NOTE — Telephone Encounter (Signed)
Pt called to req change from tab form to caps for prozac because caps are a huge cost savings. Done.

## 2015-11-07 ENCOUNTER — Telehealth: Payer: Self-pay | Admitting: Emergency Medicine

## 2015-11-07 NOTE — Telephone Encounter (Signed)
Resolved

## 2015-11-13 ENCOUNTER — Telehealth: Payer: Self-pay

## 2015-11-14 NOTE — Telephone Encounter (Signed)
Pt is needing to talk with someone about getting a appetite suppressent because the prozac has increased her appetite   Best number [phone]

## 2015-11-18 NOTE — Telephone Encounter (Signed)
Patient needs to come in for an OV for this.

## 2015-11-18 NOTE — Telephone Encounter (Signed)
Patient states she was seen by Kingsley Spittle and he prescribed prozac for her at that time.  She states that every day her hunger seems to increase since starting the medication and all she thinks about is what she is cooking for her next meal.  She would like to know if she can be put on an appetite suppressant to help her with this.  Please advise.

## 2015-11-20 NOTE — Telephone Encounter (Signed)
Spoke with pt. She is going to come in for an OV toward the end of month. She is feeling better just and increased appetite.

## 2016-03-12 ENCOUNTER — Ambulatory Visit: Payer: BLUE CROSS/BLUE SHIELD | Admitting: Urgent Care

## 2016-03-13 ENCOUNTER — Ambulatory Visit (INDEPENDENT_AMBULATORY_CARE_PROVIDER_SITE_OTHER): Payer: BLUE CROSS/BLUE SHIELD | Admitting: Urgent Care

## 2016-03-13 VITALS — BP 146/74 | HR 66 | Temp 98.1°F | Ht 66.6 in | Wt 180.0 lb

## 2016-03-13 DIAGNOSIS — J3489 Other specified disorders of nose and nasal sinuses: Secondary | ICD-10-CM | POA: Diagnosis not present

## 2016-03-13 DIAGNOSIS — J302 Other seasonal allergic rhinitis: Secondary | ICD-10-CM

## 2016-03-13 DIAGNOSIS — R110 Nausea: Secondary | ICD-10-CM | POA: Diagnosis not present

## 2016-03-13 DIAGNOSIS — J019 Acute sinusitis, unspecified: Secondary | ICD-10-CM

## 2016-03-13 MED ORDER — PSEUDOEPHEDRINE HCL 60 MG PO TABS: 60.0000 mg | ORAL_TABLET | Freq: Three times a day (TID) | ORAL | 0 refills | Status: DC | PRN

## 2016-03-13 MED ORDER — AMOXICILLIN 500 MG PO CAPS: 500.0000 mg | ORAL_CAPSULE | Freq: Two times a day (BID) | ORAL | 0 refills | Status: DC

## 2016-03-13 NOTE — Patient Instructions (Addendum)
Allergic Rhinitis Allergic rhinitis is when the mucous membranes in the nose respond to allergens. Allergens are particles in the air that cause your body to have an allergic reaction. This causes you to release allergic antibodies. Through a chain of events, these eventually cause you to release histamine into the blood stream. Although meant to protect the body, it is this release of histamine that causes your discomfort, such as frequent sneezing, congestion, and an itchy, runny nose. What are the causes? Seasonal allergic rhinitis (hay fever) is caused by pollen allergens that may come from grasses, trees, and weeds. Year-round allergic rhinitis (perennial allergic rhinitis) is caused by allergens such as house dust mites, pet dander, and mold spores. What are the signs or symptoms?  Nasal stuffiness (congestion).  Itchy, runny nose with sneezing and tearing of the eyes. How is this diagnosed? Your health care provider can help you determine the allergen or allergens that trigger your symptoms. If you and your health care provider are unable to determine the allergen, skin or blood testing may be used. Your health care provider will diagnose your condition after taking your health history and performing a physical exam. Your health care provider may assess you for other related conditions, such as asthma, pink eye, or an ear infection. How is this treated? Allergic rhinitis does not have a cure, but it can be controlled by:  Medicines that block allergy symptoms. These may include allergy shots, nasal sprays, and oral antihistamines.  Avoiding the allergen.  Hay fever may often be treated with antihistamines in pill or nasal spray forms. Antihistamines block the effects of histamine. There are over-the-counter medicines that may help with nasal congestion and swelling around the eyes. Check with your health care provider before taking or giving this medicine. If avoiding the allergen or the  medicine prescribed do not work, there are many new medicines your health care provider can prescribe. Stronger medicine may be used if initial measures are ineffective. Desensitizing injections can be used if medicine and avoidance does not work. Desensitization is when a patient is given ongoing shots until the body becomes less sensitive to the allergen. Make sure you follow up with your health care provider if problems continue. Follow these instructions at home: It is not possible to completely avoid allergens, but you can reduce your symptoms by taking steps to limit your exposure to them. It helps to know exactly what you are allergic to so that you can avoid your specific triggers. Contact a health care provider if:  You have a fever.  You develop a cough that does not stop easily (persistent).  You have shortness of breath.  You start wheezing.  Symptoms interfere with normal daily activities. This information is not intended to replace advice given to you by your health care provider. Make sure you discuss any questions you have with your health care provider. Document Released: 09/23/2000 Document Revised: 08/30/2015 Document Reviewed: 09/05/2012 Elsevier Interactive Patient Education  2017 Elsevier Inc.    Sinusitis, Adult Sinusitis is soreness and inflammation of your sinuses. Sinuses are hollow spaces in the bones around your face. Your sinuses are located:  Around your eyes.  In the middle of your forehead.  Behind your nose.  In your cheekbones.  Your sinuses and nasal passages are lined with a stringy fluid (mucus). Mucus normally drains out of your sinuses. When your nasal tissues become inflamed or swollen, the mucus can become trapped or blocked so air cannot flow through your sinuses.   This allows bacteria, viruses, and funguses to grow, which leads to infection. Sinusitis can develop quickly and last for 7?10 days (acute) or for more than 12 weeks (chronic).  Sinusitis often develops after a cold. What are the causes? This condition is caused by anything that creates swelling in the sinuses or stops mucus from draining, including:  Allergies.  Asthma.  Bacterial or viral infection.  Abnormally shaped bones between the nasal passages.  Nasal growths that contain mucus (nasal polyps).  Narrow sinus openings.  Pollutants, such as chemicals or irritants in the air.  A foreign object stuck in the nose.  A fungal infection. This is rare.  What increases the risk? The following factors may make you more likely to develop this condition:  Having allergies or asthma.  Having had a recent cold or respiratory tract infection.  Having structural deformities or blockages in your nose or sinuses.  Having a weak immune system.  Doing a lot of swimming or diving.  Overusing nasal sprays.  Smoking.  What are the signs or symptoms? The main symptoms of this condition are pain and a feeling of pressure around the affected sinuses. Other symptoms include:  Upper toothache.  Earache.  Headache.  Bad breath.  Decreased sense of smell and taste.  A cough that may get worse at night.  Fatigue.  Fever.  Thick drainage from your nose. The drainage is often green and it may contain pus (purulent).  Stuffy nose or congestion.  Postnasal drip. This is when extra mucus collects in the throat or back of the nose.  Swelling and warmth over the affected sinuses.  Sore throat.  Sensitivity to light.  How is this diagnosed? This condition is diagnosed based on symptoms, a medical history, and a physical exam. To find out if your condition is acute or chronic, your health care provider may:  Look in your nose for signs of nasal polyps.  Tap over the affected sinus to check for signs of infection.  View the inside of your sinuses using an imaging device that has a light attached (endoscope).  If your health care provider suspects  that you have chronic sinusitis, you may also:  Be tested for allergies.  Have a sample of mucus taken from your nose (nasal culture) and checked for bacteria.  Have a mucus sample examined to see if your sinusitis is related to an allergy.  If your sinusitis does not respond to treatment and it lasts longer than 8 weeks, you may have an MRI or CT scan to check your sinuses. These scans also help to determine how severe your infection is. In rare cases, a bone biopsy may be done to rule out more serious types of fungal sinus disease. How is this treated? Treatment for sinusitis depends on the cause and whether your condition is chronic or acute. If a virus is causing your sinusitis, your symptoms will go away on their own within 10 days. You may be given medicines to relieve your symptoms, including:  Topical nasal decongestants. They shrink swollen nasal passages and let mucus drain from your sinuses.  Antihistamines. These drugs block inflammation that is triggered by allergies. This can help to ease swelling in your nose and sinuses.  Topical nasal corticosteroids. These are nasal sprays that ease inflammation and swelling in your nose and sinuses.  Nasal saline washes. These rinses can help to get rid of thick mucus in your nose.  If your condition is caused by bacteria, you will   be given an antibiotic medicine. If your condition is caused by a fungus, you will be given an antifungal medicine. Surgery may be needed to correct underlying conditions, such as narrow nasal passages. Surgery may also be needed to remove polyps. Follow these instructions at home: Medicines  Take, use, or apply over-the-counter and prescription medicines only as told by your health care provider. These may include nasal sprays.  If you were prescribed an antibiotic medicine, take it as told by your health care provider. Do not stop taking the antibiotic even if you start to feel better. Hydrate and  Humidify  Drink enough water to keep your urine clear or pale yellow. Staying hydrated will help to thin your mucus.  Use a cool mist humidifier to keep the humidity level in your home above 50%.  Inhale steam for 10-15 minutes, 3-4 times a day or as told by your health care provider. You can do this in the bathroom while a hot shower is running.  Limit your exposure to cool or dry air. Rest  Rest as much as possible.  Sleep with your head raised (elevated).  Make sure to get enough sleep each night. General instructions  Apply a warm, moist washcloth to your face 3-4 times a day or as told by your health care provider. This will help with discomfort.  Wash your hands often with soap and water to reduce your exposure to viruses and other germs. If soap and water are not available, use hand sanitizer.  Do not smoke. Avoid being around people who are smoking (secondhand smoke).  Keep all follow-up visits as told by your health care provider. This is important. Contact a health care provider if:  You have a fever.  Your symptoms get worse.  Your symptoms do not improve within 10 days. Get help right away if:  You have a severe headache.  You have persistent vomiting.  You have pain or swelling around your face or eyes.  You have vision problems.  You develop confusion.  Your neck is stiff.  You have trouble breathing. This information is not intended to replace advice given to you by your health care provider. Make sure you discuss any questions you have with your health care provider. Document Released: 12/29/2004 Document Revised: 08/25/2015 Document Reviewed: 10/24/2014 Elsevier Interactive Patient Education  2017 Elsevier Inc.   IF you received an x-ray today, you will receive an invoice from [facility]. Please contact [facility] at [phone] with questions or concerns regarding your invoice.   IF you received labwork today, you will  receive an invoice from [facility]. Please contact [facility] at [phone] with questions or concerns regarding your invoice.   Our billing staff will not be able to assist you with questions regarding bills from these companies.  You will be contacted with the lab results as soon as they are available. The fastest way to get your results is to activate your My Chart account. Instructions are located on the last page of this paperwork. If you have not heard from us regarding the results in 2 weeks, please contact this office.      

## 2016-03-13 NOTE — Progress Notes (Signed)
  MRN: [id number] DOB: 03-Jul-1955  Subjective:   Charlene Cox is a 61 y.o. female presenting for chief complaint of Tinnitus (X 2 weeks)  Reports 2 week history of tinnitus, bilateral ear pressure, sinus headaches/pressure, nasal congestion, dry cough. Has a history of seasonal allergies, has not started her allergy medications. Denies fever, sinus pain, ear pain, ear drainage, chest, pain, shob.   Charlene Cox has a current medication list which includes the following prescription(s): cetirizine, fluoxetine, fluticasone, lisinopril-hydrochlorothiazide, and montelukast. Also has No Known Allergies.  Charlene Cox  has a past medical history of Allergy; Anxiety; and Arthritis. Denies past surgical history.  Objective:   Vitals: BP (!) 146/74 (BP Location: Right Arm, Patient Position: Sitting, Cuff Size: Small)   Pulse 66   Temp 98.1 F (36.7 C) (Oral)   Ht 5' 6.6" (1.692 m)   Wt 180 lb (81.6 kg)   SpO2 99%   BMI 28.53 kg/m   Wt Readings from Last 3 Encounters:  03/13/16 180 lb (81.6 kg)  11/04/15 180 lb (81.6 kg)  12/12/13 164 lb 2 oz (74.4 kg)    Physical Exam  Constitutional: She is oriented to person, place, and time. She appears well-developed and well-nourished.  HENT:  TM's with bilateral effusions but no erythema, drainage. Nasal turbinates dry and erythematous, nasal passages patent. Bilateral maxillary sinus tenderness. Oropharynx with moderate post-nasal drainage, mucous membranes moist, dentition in good repair.  Eyes: Right eye exhibits no discharge. Left eye exhibits no discharge.  Neck: Normal range of motion. Neck supple.  Cardiovascular: Normal rate.   Pulmonary/Chest: Effort normal.  Lymphadenopathy:    She has no cervical adenopathy.  Neurological: She is alert and oriented to person, place, and time.  Skin: Skin is warm and dry.   Assessment and Plan :   1. Acute seasonal allergic rhinitis due to other allergen 2. Sinus pain 3. Acute non-recurrent sinusitis,  unspecified location 4. Nausea without vomiting - Restart allergy medications. Will address secondary sinus infection with amoxicillin. Rest, hydrate better. RTC if no improvement.  Jaynee Eagles, PA-C [facility] [phone] 03/13/2016  3:08 PM

## 2016-03-28 ENCOUNTER — Encounter: Payer: Self-pay | Admitting: Physician Assistant

## 2016-03-28 ENCOUNTER — Ambulatory Visit (INDEPENDENT_AMBULATORY_CARE_PROVIDER_SITE_OTHER): Payer: BLUE CROSS/BLUE SHIELD | Admitting: Physician Assistant

## 2016-03-28 VITALS — BP 112/64 | HR 69 | Temp 98.3°F | Resp 16 | Ht 66.6 in | Wt 180.0 lb

## 2016-03-28 DIAGNOSIS — H1012 Acute atopic conjunctivitis, left eye: Secondary | ICD-10-CM | POA: Diagnosis not present

## 2016-03-28 DIAGNOSIS — F419 Anxiety disorder, unspecified: Secondary | ICD-10-CM | POA: Insufficient documentation

## 2016-03-28 DIAGNOSIS — T7840XA Allergy, unspecified, initial encounter: Secondary | ICD-10-CM | POA: Diagnosis not present

## 2016-03-28 DIAGNOSIS — I10 Essential (primary) hypertension: Secondary | ICD-10-CM

## 2016-03-28 HISTORY — DX: Essential (primary) hypertension: I10

## 2016-03-28 MED ORDER — OLOPATADINE HCL 0.2 % OP SOLN: 1.0000 [drp] | Freq: Every day | OPHTHALMIC | 1 refills | Status: DC

## 2016-03-28 NOTE — Patient Instructions (Addendum)
Naphcon --   Zaditor -- this is what I use (find the generic kind)    IF you received an x-ray today, you will receive an invoice from [facility]. Please contact [facility] at [phone] with questions or concerns regarding your invoice.   IF you received labwork today, you will receive an invoice from [facility]. Please contact [facility] at [phone] with questions or concerns regarding your invoice.   Our billing staff will not be able to assist you with questions regarding bills from these companies.  You will be contacted with the lab results as soon as they are available. The fastest way to get your results is to activate your My Chart account. Instructions are located on the last page of this paperwork. If you have not heard from [place] regarding the results in 2 weeks, please contact this office.

## 2016-03-28 NOTE — Progress Notes (Signed)
   Charlene Cox  MRN: [id number] DOB: September 03, 1955  PCP: No PCP Per Patient  Chief Complaint  Patient presents with  . Eye Irritation    Left, X 4 days    Subjective:  Pt presents to clinic for left eye for the last 3-4 days she has had irritation - she thought it might be allergies and she started using her allergy medications.  The eye is scratchy and itchy and now feels dry. No recent cold symptoms. No injury to eye known. Works with a Teaching laboratory technician at work.  Review of Systems  Constitutional: Negative for chills and fever.  Eyes: Positive for itching and visual disturbance (after looking at something a long time  hard to focus). Negative for photophobia, pain, discharge and redness.  Allergic/Immunologic: Positive for environmental allergies.    Patient Active Problem List   Diagnosis Date Noted  . HTN (hypertension) 03/28/2016  . Allergy 03/28/2016  . Anxiety 03/28/2016    Current Outpatient Prescriptions on File Prior to Visit  Medication Sig Dispense Refill  . cetirizine (ZYRTEC) 10 MG tablet Take 1 tablet (10 mg total) by mouth daily. 30 tablet 11  . FLUoxetine (PROZAC) 20 MG capsule Take 1 capsule (20 mg total) by mouth daily. 90 capsule 3  . fluticasone (FLONASE) 50 MCG/ACT nasal spray Place 2 sprays into both nostrils daily. 16 g 11  . lisinopril-hydrochlorothiazide (PRINZIDE,ZESTORETIC) 10-12.5 MG tablet Take 1 tablet by mouth daily. 90 tablet 3  . montelukast (SINGULAIR) 10 MG tablet Take 1 tablet (10 mg total) by mouth at bedtime. 30 tablet 3  . pseudoephedrine (SUDAFED) 60 MG tablet Take 1 tablet (60 mg total) by mouth every 8 (eight) hours as needed. 30 tablet 0   No current facility-administered medications on file prior to visit.     No Known Allergies  Pt patients past, family and social history were reviewed and updated.   Objective:  BP 112/64   Pulse 69   Temp 98.3 F (36.8 C) (Oral)   Resp 16   Ht 5' 6.6" (1.692 m)   Wt 180 lb (81.6 kg)   SpO2  96%   BMI 28.53 kg/m   Physical Exam  Constitutional: She is oriented to person, place, and time and well-developed, well-nourished, and in no distress.  HENT:  Head: Normocephalic and atraumatic.  Right Ear: Hearing and external ear normal.  Left Ear: Hearing and external ear normal.  Eyes: EOM are normal. Pupils are equal, round, and reactive to light. Left conjunctiva is injected (mild - mild cobblestoning).  Neck: Normal range of motion.  Pulmonary/Chest: Effort normal.  Neurological: She is alert and oriented to person, place, and time. Gait normal.  Skin: Skin is warm and dry.  Psychiatric: Mood, memory, affect and judgment normal.  Vitals reviewed.    Visual Acuity Screening   Right eye Left eye Both eyes  Without correction: 20/20 20/40-1 20/20-1  With correction:       Assessment and Plan :  Allergic conjunctivitis of left eye - Plan: Olopatadine HCl 0.2 % SOLN  Allergic state, initial encounter   Blink more often -  Use topical artifical tears to help with discomfort  Windell Hummingbird PA-C  [facility] 03/28/2016 2:32 PM

## 2016-10-16 ENCOUNTER — Ambulatory Visit (INDEPENDENT_AMBULATORY_CARE_PROVIDER_SITE_OTHER): Payer: BLUE CROSS/BLUE SHIELD | Admitting: Urgent Care

## 2016-10-16 ENCOUNTER — Encounter: Payer: Self-pay | Admitting: Urgent Care

## 2016-10-16 VITALS — BP 124/80 | HR 74 | Temp 98.8°F | Resp 17 | Ht 66.5 in | Wt 166.0 lb

## 2016-10-16 DIAGNOSIS — Z5189 Encounter for other specified aftercare: Secondary | ICD-10-CM

## 2016-10-16 NOTE — Patient Instructions (Addendum)
How to Change Your Dressing A dressing is a material that is placed in and over wounds. A dressing helps your wound to heal by protecting it from bacteria, further injury, and becoming too dry or too wet. What are the risks? The adhesive tape that is used with a dressing may make your skin sore or irritated or cause a rash. These are the most common problems. However, more serious problems can develop, such as:  Bleeding.  Infection.  How to change your dressing How often you change your dressing will depend on your wound. Change the dressing as often as told by your health care provider. Preparing to Change Your Dressing  Take a shower before you do the first dressing change of the day. If your health care provider does not want your wound to get wet and your dressing is not waterproof, you may need to apply plastic leak-proof sealing wrap to your dressing for protection.  If needed, take pain medicine 30 minutes before the dressing change as prescribed by your health care provider.  Set up a clean station for wound care. You will need: ? A disposable garbage bag that is open and ready to use. ? Hand sanitizer. ? Wound cleanser or salt-water solution (saline) as told by your health care provider. ? New dressing material or bandages. Make sure to open the dressing package so the dressing remains on the inside of the package. You may also need the following in your clean station:  A box of vinyl gloves.  Tape.  Skin protectant. This may be a wipe, film, or spray.  Clean or germ-free (sterile) scissors.  A cotton-tipped applicator.  Removing Your Old Dressing  Wash your hands with soap and water. Dry your hands with a clean towel. If soap and water are not available, use hand sanitizer.  If you are using gloves, put the gloves on before you remove the dressing.  Gently remove any adhesive or tape by pulling it off in the direction of your hair growth. Only touch the outside  edges of the dressing.  Take off the dressing. If the dressing sticks to your skin, use a sterile salt-water solution to wet the dressing. This helps it to come off more easily.  Remove any gauze or packing in your wound.  Throw the old dressing supplies into the ready garbage bag.  Remove each glove by grabbing the cuff with the opposite hand and turning the glove inside out. Place the gloves in the trash immediately.  Wash your hands with soap and water. Dry your hands with a clean towel. If soap and water are not available, use hand sanitizer. Cleaning Your Wound  Follow instructions from your health care provider about how to clean your wound. This may include using a saline or recommended wound cleanser.  Do not use over-the-counter medicated or antiseptic creams, sprays, liquids, or dressings unless told to do so by your health care provider.  Use a clean gauze pad to clean the area thoroughly with the recommended saline solution or wound cleanser.  Throw the gauze pad into the garbage bag.  Wash your hands with soap and water. Dry your hands with a clean towel. If soap and water are not available, use hand sanitizer. Applying the Dressing  If your health care provider recommended a skin protectant, apply it to the skin around the wound.  Cover the wound with the recommended dressing, such as a nonstick gauze or bandage. Make sure to touch only the outside  edges of the dressing. Do not touch the inside of the dressing.  Secure the dressing so all sides stay in place. You may do this with the attached medical adhesive, roll gauze, or tape. If you use tape, do not wrap the tape all the way around your arm or leg.  Take off your gloves. Put them in the plastic bag with the old dressing. Tie the bag shut and throw it away.  Wash your hands with soap and water. Dry your hands with a clean towel. If soap and water are not available, use hand sanitizer. Contact a health care provider  if:   You have new pain.  You develop irritation, a rash, or itching around the wound or dressing.  Changing your dressing causes pain or a lot of bleeding. Get help right away if:  You have severe pain.  You have signs of infection, such as: ? More redness, swelling, or pain. ? More fluid or blood. ? Warmth. ? Pus or a bad smell. ? Red streaks leading from wound. ? A fever. This information is not intended to replace advice given to you by your health care provider. Make sure you discuss any questions you have with your health care provider. Document Released: 02/06/2004 Document Revised: 05/29/2015 Document Reviewed: 10/04/2014 Elsevier Interactive Patient Education  2018 [employer].     IF you received an x-ray today, you will receive an invoice from [facility]. Please contact [facility] at [phone] with questions or concerns regarding your invoice.   IF you received labwork today, you will receive an invoice from [facility]. Please contact [facility] at [phone] with questions or concerns regarding your invoice.   Our billing staff will not be able to assist you with questions regarding bills from these companies.  You will be contacted with the lab results as soon as they are available. The fastest way to get your results is to activate your My Chart account. Instructions are located on the last page of this paperwork. If you have not heard from [place] regarding the results in 2 weeks, please contact this office.

## 2016-10-16 NOTE — Progress Notes (Signed)
    MRN: [id number] DOB: [birth year]  Subjective:   Charlene Cox is a 61 y.o. female presenting for wound care. She reports having hormonal therapy inserted to the posterior right buttock. She is presenting for dressing change and is very hesitant . Denies fever, pain, drainage of pus or bleeding.   Charlene Cox has a current medication list which includes the following prescription(s): cetirizine, fluoxetine, fluticasone, lisinopril-hydrochlorothiazide, montelukast, olopatadine hcl, and pseudoephedrine. Also has No Known Allergies.  Charlene Cox  has a past medical history of Allergy; Anxiety; and Arthritis. Also  has no past surgical history on file.  Objective:   Vitals: BP 124/80   Pulse 74   Temp 98.8 F (37.1 C) (Oral)   Resp 17   Ht 5' 6.5" (1.689 m)   Wt 166 lb (75.3 kg)   SpO2 98%   BMI 26.39 kg/m   Physical Exam  Constitutional: She is oriented to person, place, and time. She appears well-developed and well-nourished.  Cardiovascular: Normal rate.   Pulmonary/Chest: Effort normal.  Neurological: She is alert and oriented to person, place, and time.   Wound Care: Dressing removed. Less than 0.5cm wound is clean and dry. Steri-strips reapplied. Non-adherent dressings placed and secured with Tega-derm.  Assessment and Plan :   1. Encounter for wound care - Wound care reviewed. If patient is unable to change dressing at home, I recommended she come in for wound care tomorrow.  Jaynee Eagles, PA-C [facility] [phone] 10/16/2016 12:01 PM

## 2016-10-17 ENCOUNTER — Ambulatory Visit: Payer: BLUE CROSS/BLUE SHIELD | Admitting: Physician Assistant

## 2016-12-14 ENCOUNTER — Ambulatory Visit: Payer: BLUE CROSS/BLUE SHIELD | Admitting: Physician Assistant

## 2016-12-24 ENCOUNTER — Ambulatory Visit (INDEPENDENT_AMBULATORY_CARE_PROVIDER_SITE_OTHER): Payer: BLUE CROSS/BLUE SHIELD | Admitting: Physician Assistant

## 2016-12-24 ENCOUNTER — Encounter: Payer: Self-pay | Admitting: Physician Assistant

## 2016-12-24 VITALS — BP 149/71 | HR 61 | Wt 169.0 lb

## 2016-12-24 DIAGNOSIS — R350 Frequency of micturition: Secondary | ICD-10-CM | POA: Diagnosis not present

## 2016-12-24 DIAGNOSIS — Z131 Encounter for screening for diabetes mellitus: Secondary | ICD-10-CM

## 2016-12-24 DIAGNOSIS — I10 Essential (primary) hypertension: Secondary | ICD-10-CM | POA: Diagnosis not present

## 2016-12-24 DIAGNOSIS — Z7689 Persons encountering health services in other specified circumstances: Secondary | ICD-10-CM | POA: Diagnosis not present

## 2016-12-24 DIAGNOSIS — Z13220 Encounter for screening for lipoid disorders: Secondary | ICD-10-CM | POA: Diagnosis not present

## 2016-12-24 DIAGNOSIS — Z130 Encounter for screening for diseases of the blood and blood-forming organs and certain disorders involving the immune mechanism: Secondary | ICD-10-CM

## 2016-12-24 DIAGNOSIS — Z1159 Encounter for screening for other viral diseases: Secondary | ICD-10-CM

## 2016-12-24 DIAGNOSIS — Z79890 Hormone replacement therapy (postmenopausal): Secondary | ICD-10-CM

## 2016-12-24 MED ORDER — CEPHALEXIN 500 MG PO CAPS: 500.0000 mg | ORAL_CAPSULE | Freq: Two times a day (BID) | ORAL | 0 refills | Status: DC

## 2016-12-24 MED ORDER — ASPIRIN EC 81 MG PO TBEC: 81.0000 mg | DELAYED_RELEASE_TABLET | Freq: Every day | ORAL | 3 refills | Status: DC

## 2016-12-24 NOTE — Patient Instructions (Addendum)
For your blood pressure: - Goal <130/80 - Take your blood pressure medication daily as prescribed - Start baby aspirin 81 mg to help prevent heart attack/stroke - Check blood pressure at home for the next 2 weeks - Check around the same time each day in a relaxed setting - Limit salt to <2000 mg/day - Follow DASH eating plan - limit alcohol to 1 standard drink per day  - avoid tobacco products - weight loss: 7% of current body weight - Follow-up in 2 weeks  For urinary tract infection: - take antibiotic twice a day for full 7 days - okay to continue Uricalm as needed for frequency, urgency, or pain  I have also ordered fasting labs. The lab is a walk-in open M-F 7:30a-4:30p (closed 12:30-1:30p). Nothing to eat or drink after midnight or at least 8 hours before your blood draw. You can have water and your medications.

## 2016-12-24 NOTE — Progress Notes (Signed)
HPI:                                                                Charlene Cox is a 61 y.o. female who presents to [facility]: [place] today to establish care  Current concerns include: UTI symptoms  UTI: reports suprapubic fullness and urgency for the last 2 weeks. Symptoms are gradually worsening. She became concerned about urinary leakage that developed over the last week. Denies fever, chills, dysuria, nausea, flank pain. Denies history of incontinence. Reports she is on bio-identical hormone therapy Has been taking Uricalm with moderate relief.  HTN: prescribed Lisinopril-HCTZ. Not compliant with medications; reports she stopped it 2 weeks ago due to . Denies vision change, headache, chest pain with exertion, orthopnea, lightheadedness, syncope and edema. Risk factors include: age>55   Past Medical History:  Diagnosis Date  . Allergy   . Anxiety   . Arthritis   . Chronic low back pain   . HTN (hypertension) 03/28/2016  . Osteoarthritis of left hip    severe determined by x-ray   History reviewed. No pertinent surgical history. Social History   Tobacco Use  . Smoking status: Never Smoker  . Smokeless tobacco: Never Used  Substance Use Topics  . Alcohol use: No    Alcohol/week: 0.0 oz   family history is not on file.  ROS: negative except as noted in the HPI  Medications: Current Outpatient Medications  Medication Sig Dispense Refill  . cetirizine (ZYRTEC) 10 MG tablet Take 1 tablet (10 mg total) by mouth daily. 30 tablet 11  . fluticasone (FLONASE) 50 MCG/ACT nasal spray Place 2 sprays into both nostrils daily. 16 g 11  . aspirin EC 81 MG tablet Take 1 tablet (81 mg total) by mouth daily. 90 tablet 3  . cephALEXin (KEFLEX) 500 MG capsule Take 1 capsule (500 mg total) by mouth 2 (two) times daily. 14 capsule 0  . lisinopril-hydrochlorothiazide (PRINZIDE,ZESTORETIC) 10-12.5 MG tablet Take 1 tablet by mouth daily. (Patient  not taking: Reported on 12/24/2016) 90 tablet 3  . Olopatadine HCl 0.2 % SOLN Apply 1 drop to eye daily. (Patient not taking: Reported on 12/24/2016) 2.5 mL 1   No current facility-administered medications for this visit.    No Known Allergies     Objective:  BP (!) 149/71   Pulse 61   Wt 169 lb (76.7 kg)   SpO2 95%   BMI 26.87 kg/m  Gen:  alert, not ill-appearing, no distress, appears younger than stated age 1: head normocephalic without obvious abnormality, conjunctiva and cornea clear, trachea midline Pulm: Normal work of breathing, normal phonation, clear to auscultation bilaterally, no wheezes, rales or rhonchi CV: Normal rate, regular rhythm, s1 and s2 distinct, no murmurs, clicks or rubs GI: abdomen soft, nontender, no CVA tenderness  Neuro: alert and oriented x 3, no tremor MSK: extremities atraumatic, normal gait and station Skin: intact, no rashes on exposed skin, no jaundice, no cyanosis Psych: well-groomed, cooperative, good eye contact, euthymic mood, affect mood-congruent, speech is articulate, and thought processes clear and goal-directed  Depression screen [facility] 2/9 12/24/2016 10/16/2016 03/28/2016 03/13/2016 11/04/2015  Decreased Interest 0 0 0 0 0  Down, Depressed, Hopeless 0 0 0 0 0  PHQ - 2 Score  0 0 0 0 0     No results found for this or any previous visit (from the past 62 hour(s)). No results found.    Assessment and Plan: 61 y.o. female with   1. Encounter to establish care - reviewed PMH, PSH, PFH, medicaitons and allergies - reviewed health maintenance - overdue for colon cancer screening - Pap smear UTD per patient, requesting outside records - negative PHQ2 - declines influenza  2. Urinary frequency - UA and culture pending. Treating empirically with Keflex - cephALEXin (KEFLEX) 500 MG capsule; Take 1 capsule (500 mg total) by mouth 2 (two) times daily.  Dispense: 14 capsule; Refill: 0 - Urinalysis, Routine w reflex microscopic - Urine  Culture  3. Encounter for hepatitis C screening test for low risk patient - Hepatitis C antibody  4. Encounter for screening for lipid disorder - Lipid Panel w/reflex Direct LDL  5. Screening for diabetes mellitus - COMPLETE METABOLIC PANEL WITH GFR  6. Screening for blood disease - CBC  7. Elevated blood pressure reading in office with diagnosis of hypertension BP Readings from Last 3 Encounters:  12/24/16 (!) 149/71  10/16/16 124/80  03/28/16 112/64  - counseled on importance of taking her medication daily. Recommended baby aspirin for primary prevention. Counseled on therapeutic lifestyle changes. Follow-up in 2 weeks - aspirin EC 81 MG tablet; Take 1 tablet (81 mg total) by mouth daily.  Dispense: 90 tablet; Refill: 3  8. On postmenopausal hormone replacement therapy - she is managed by another provider and doing bio-identical pellets  Patient education and anticipatory guidance given Patient agrees with treatment plan Follow-up in 2 weeks for hypertension and to discuss weight loss or sooner as needed if symptoms worsen or fail to improve  Darlyne Russian PA-C

## 2016-12-25 LAB — URINE CULTURE
MICRO NUMBER:: 81402912
Result:: NO GROWTH
SPECIMEN QUALITY:: ADEQUATE

## 2016-12-25 LAB — EXTRA URINE SPECIMEN

## 2016-12-31 ENCOUNTER — Encounter: Payer: Self-pay | Admitting: Physician Assistant

## 2016-12-31 NOTE — Progress Notes (Signed)
Urine culture was negative for an infection Remind her to have her fasting blood work completed

## 2017-01-07 ENCOUNTER — Ambulatory Visit: Payer: BLUE CROSS/BLUE SHIELD | Admitting: Physician Assistant

## 2017-03-17 ENCOUNTER — Ambulatory Visit: Payer: BLUE CROSS/BLUE SHIELD | Admitting: Family Medicine

## 2017-03-17 NOTE — Progress Notes (Deleted)
  No chief complaint on file.   HPI  4 review of systems  Past Medical History:  Diagnosis Date  . Allergy   . Anxiety   . Arthritis   . Chronic low back pain   . HTN (hypertension) 03/28/2016  . Osteoarthritis of left hip    severe determined by x-ray    Current Outpatient Medications  Medication Sig Dispense Refill  . aspirin EC 81 MG tablet Take 1 tablet (81 mg total) by mouth daily. 90 tablet 3  . cephALEXin (KEFLEX) 500 MG capsule Take 1 capsule (500 mg total) by mouth 2 (two) times daily. 14 capsule 0  . cetirizine (ZYRTEC) 10 MG tablet Take 1 tablet (10 mg total) by mouth daily. 30 tablet 11  . fluticasone (FLONASE) 50 MCG/ACT nasal spray Place 2 sprays into both nostrils daily. 16 g 11  . lisinopril-hydrochlorothiazide (PRINZIDE,ZESTORETIC) 10-12.5 MG tablet Take 1 tablet by mouth daily. (Patient not taking: Reported on 12/24/2016) 90 tablet 3  . Olopatadine HCl 0.2 % SOLN Apply 1 drop to eye daily. (Patient not taking: Reported on 12/24/2016) 2.5 mL 1   No current facility-administered medications for this visit.     Allergies: No Known Allergies  No past surgical history on file.  Social History   Socioeconomic History  . Marital status: Married    Spouse name: Not on file  . Number of children: Not on file  . Years of education: Not on file  . Highest education level: Not on file  Social Needs  . Financial resource strain: Not on file  . Food insecurity - worry: Not on file  . Food insecurity - inability: Not on file  . Transportation needs - medical: Not on file  . Transportation needs - non-medical: Not on file  Occupational History  . Not on file  Tobacco Use  . Smoking status: Never Smoker  . Smokeless tobacco: Never Used  Substance and Sexual Activity  . Alcohol use: No    Alcohol/week: 0.0 oz  . Drug use: No  . Sexual activity: Not on file  Other Topics Concern  . Not on file  Social History Narrative  . Not on file    No family history  on file.   ROS Review of Systems See HPI Constitution: No fevers or chills No malaise No diaphoresis Skin: No rash or itching Eyes: no blurry vision, no double vision GU: no dysuria or hematuria Neuro: no dizziness or headaches * all others reviewed and negative   Objective: There were no vitals filed for this visit.  Physical Exam  Assessment and Plan There are no diagnoses linked to this encounter.   Charlene Cox

## 2017-08-25 ENCOUNTER — Other Ambulatory Visit: Payer: Self-pay

## 2017-08-25 ENCOUNTER — Encounter: Payer: Self-pay | Admitting: Emergency Medicine

## 2017-08-25 ENCOUNTER — Ambulatory Visit (INDEPENDENT_AMBULATORY_CARE_PROVIDER_SITE_OTHER): Payer: BLUE CROSS/BLUE SHIELD | Admitting: Emergency Medicine

## 2017-08-25 VITALS — BP 148/70 | HR 65 | Temp 98.0°F | Resp 16 | Ht 67.13 in | Wt 175.0 lb

## 2017-08-25 DIAGNOSIS — J069 Acute upper respiratory infection, unspecified: Secondary | ICD-10-CM

## 2017-08-25 DIAGNOSIS — R0981 Nasal congestion: Secondary | ICD-10-CM | POA: Diagnosis not present

## 2017-08-25 MED ORDER — PSEUDOEPHEDRINE-GUAIFENESIN ER 60-600 MG PO TB12: 1.0000 | ORAL_TABLET | Freq: Two times a day (BID) | ORAL | 1 refills | Status: AC

## 2017-08-25 MED ORDER — AZITHROMYCIN 250 MG PO TABS: ORAL_TABLET | ORAL | 0 refills | Status: DC

## 2017-08-25 NOTE — Patient Instructions (Addendum)
I will contact you with your lab results within the next 2 weeks.  If you have not heard from [place] then please contact us. The fastest way to get your results is to register for My Chart.   IF you received an x-ray today, you will receive an invoice from [facility]. Please contact [facility] at [phone] with questions or concerns regarding your invoice.   IF you received labwork today, you will receive an invoice from [facility]. Please contact [facility] at [phone] with questions or concerns regarding your invoice.   Our billing staff will not be able to assist you with questions regarding bills from these companies.  You will be contacted with the lab results as soon as they are available. The fastest way to get your results is to activate your My Chart account. Instructions are located on the last page of this paperwork. If you have not heard from [place] regarding the results in 2 weeks, please contact this office.      Sinusitis, Adult Sinusitis is soreness and inflammation of your sinuses. Sinuses are hollow spaces in the bones around your face. They are located:  Around your eyes.  In the middle of your forehead.  Behind your nose.  In your cheekbones.  Your sinuses and nasal passages are lined with a stringy fluid (mucus). Mucus normally drains out of your sinuses. When your nasal tissues get inflamed or swollen, the mucus can get trapped or blocked so air cannot flow through your sinuses. This lets bacteria, viruses, and funguses grow, and that leads to infection. Follow these instructions at home: Medicines  Take, use, or apply over-the-counter and prescription medicines only as told by your doctor. These may include nasal sprays.  If you were prescribed an antibiotic medicine, take it as told by your doctor. Do not stop taking the antibiotic even if you start to feel better. Hydrate and Humidify  Drink enough water to keep your pee (urine) clear  or pale yellow.  Use a cool mist humidifier to keep the humidity level in your home above 50%.  Breathe in steam for 10-15 minutes, 3-4 times a day or as told by your doctor. You can do this in the bathroom while a hot shower is running.  Try not to spend time in cool or dry air. Rest  Rest as much as possible.  Sleep with your head raised (elevated).  Make sure to get enough sleep each night. General instructions  Put a warm, moist washcloth on your face 3-4 times a day or as told by your doctor. This will help with discomfort.  Wash your hands often with soap and water. If there is no soap and water, use hand sanitizer.  Do not smoke. Avoid being around people who are smoking (secondhand smoke).  Keep all follow-up visits as told by your doctor. This is important. Contact a doctor if:  You have a fever.  Your symptoms get worse.  Your symptoms do not get better within 10 days. Get help right away if:  You have a very bad headache.  You cannot stop throwing up (vomiting).  You have pain or swelling around your face or eyes.  You have trouble seeing.  You feel confused.  Your neck is stiff.  You have trouble breathing. This information is not intended to replace advice given to you by your health care provider. Make sure you discuss any questions you have with your health care provider. Document Released: 06/17/2007 Document  Revised: 08/25/2015 Document Reviewed: 10/24/2014 Elsevier Interactive Patient Education  [employer].

## 2017-08-25 NOTE — Progress Notes (Signed)
Charlene Cox 62 y.o.   Chief Complaint  Patient presents with  . Nasal Congestion    with some drainage   . Cough    with some sore throat and fatique x 4 days     HISTORY OF PRESENT ILLNESS: This is a 61 y.o. female complaining of 3-day history of fatigue, lightheadedness, headache, mild nausea, runny nose, sore throat and sinus congestion.  Has tried Sudafed, Flonase, and Mucinex with little relief.  Also tried NyQuil.  Some chills but no high fever.  No recent traveling.  No one else sick at home.  No vomiting or diarrhea.  Very little cough if any.  No other significant symptomatology.  HPI   Prior to Admission medications   Medication Sig Start Date End Date Taking? Authorizing Provider  aspirin EC 81 MG tablet Take 1 tablet (81 mg total) by mouth daily. 12/24/16  Yes Trixie Dredge, PA-C  cetirizine (ZYRTEC) 10 MG tablet Take 1 tablet (10 mg total) by mouth daily. 11/04/15  Yes Jaynee Eagles, PA-C  fluticasone (FLONASE) 50 MCG/ACT nasal spray Place 2 sprays into both nostrils daily. 11/04/15  Yes Jaynee Eagles, PA-C  lisinopril-hydrochlorothiazide (PRINZIDE,ZESTORETIC) 10-12.5 MG tablet Take 1 tablet by mouth daily. 11/04/15  Yes Jaynee Eagles, PA-C  Olopatadine HCl 0.2 % SOLN Apply 1 drop to eye daily. 03/28/16  Yes Weber, Damaris Hippo, PA-C    No Known Allergies  Patient Active Problem List   Diagnosis Date Noted  . Urinary frequency 12/24/2016  . On postmenopausal hormone replacement therapy 12/24/2016  . Elevated blood pressure reading in office with diagnosis of hypertension 12/24/2016  . HTN (hypertension) 03/28/2016  . Allergy 03/28/2016  . Anxiety 03/28/2016    Past Medical History:  Diagnosis Date  . Allergy   . Anxiety   . Arthritis   . Chronic low back pain   . HTN (hypertension) 03/28/2016  . Osteoarthritis of left hip    severe determined by x-ray    History reviewed. No pertinent surgical history.  Social History   Socioeconomic History    . Marital status: Married    Spouse name: Not on file  . Number of children: Not on file  . Years of education: Not on file  . Highest education level: Not on file  Occupational History  . Not on file  Social Needs  . Financial resource strain: Not on file  . Food insecurity:    Worry: Not on file    Inability: Not on file  . Transportation needs:    Medical: Not on file    Non-medical: Not on file  Tobacco Use  . Smoking status: Never Smoker  . Smokeless tobacco: Never Used  Substance and Sexual Activity  . Alcohol use: No    Alcohol/week: 0.0 standard drinks  . Drug use: No  . Sexual activity: Not on file  Lifestyle  . Physical activity:    Days per week: Not on file    Minutes per session: Not on file  . Stress: Not on file  Relationships  . Social connections:    Talks on phone: Not on file    Gets together: Not on file    Attends religious service: Not on file    Active member of club or organization: Not on file    Attends meetings of clubs or organizations: Not on file    Relationship status: Not on file  . Intimate partner violence:    Fear of current or ex partner: Not  on file    Emotionally abused: Not on file    Physically abused: Not on file    Forced sexual activity: Not on file  Other Topics Concern  . Not on file  Social History Narrative  . Not on file    History reviewed. No pertinent family history.   Review of Systems  Constitutional: Positive for chills and malaise/fatigue. Negative for fever.  HENT: Positive for congestion, sinus pain and sore throat. Negative for ear discharge and ear pain.   Eyes: Negative.  Negative for discharge and redness.  Respiratory: Positive for cough. Negative for shortness of breath and wheezing.   Cardiovascular: Negative.  Negative for chest pain and palpitations.  Gastrointestinal: Negative.  Negative for abdominal pain, diarrhea, nausea and vomiting.  Genitourinary: Negative.  Negative for dysuria and  hematuria.  Musculoskeletal: Positive for back pain, myalgias and neck pain.  Skin: Negative for rash.  Neurological: Negative.  Negative for dizziness and headaches.  Endo/Heme/Allergies: Negative.   All other systems reviewed and are negative.   Vitals:   08/25/17 0938  BP: (!) 148/70  Pulse: 65  Resp: 16  Temp: 98 F (36.7 C)  SpO2: 98%    Physical Exam  Constitutional: She is oriented to person, place, and time. She appears well-developed and well-nourished.  HENT:  Head: Normocephalic.  Right Ear: Tympanic membrane, external ear and ear canal normal.  Left Ear: Tympanic membrane, external ear and ear canal normal.  Nose: Mucosal edema, rhinorrhea and sinus tenderness present. Right sinus exhibits maxillary sinus tenderness. Left sinus exhibits maxillary sinus tenderness.  Mouth/Throat: Oropharynx is clear and moist.  Eyes: Pupils are equal, round, and reactive to light. Conjunctivae and EOM are normal.  Neck: Normal range of motion. Neck supple. No JVD present. No thyromegaly present.  Cardiovascular: Normal rate, regular rhythm and normal heart sounds.  Pulmonary/Chest: Effort normal and breath sounds normal.  Abdominal: Soft. She exhibits no distension. There is no tenderness.  Musculoskeletal: Normal range of motion. She exhibits no edema or tenderness.  Lymphadenopathy:    She has no cervical adenopathy.  Neurological: She is alert and oriented to person, place, and time. No sensory deficit. She exhibits normal muscle tone.  Skin: Skin is warm. Capillary refill takes less than 2 seconds.  Psychiatric: She has a normal mood and affect. Her behavior is normal.  Vitals reviewed.  A total of 25 minutes was spent in the room with the patient, greater than 50% of which was in counseling/coordination of care regarding differential diagnosis, treatment, medications, prognosis, and need for follow-up if no better or worse.   ASSESSMENT & PLAN: [name] was seen today for  nasal congestion and cough.  Diagnoses and all orders for this visit:  Sinus congestion -     pseudoephedrine-guaifenesin (MUCINEX D) 60-600 MG 12 hr tablet; Take 1 tablet by mouth every 12 (twelve) hours for 5 days.  Acute upper respiratory infection Comments: Suspected sinus infection Orders: -     azithromycin (ZITHROMAX) 250 MG tablet; Sig as indicated    Patient Instructions       I will contact you with your lab results within the next 2 weeks.  If you have not heard from [place] then please contact us. The fastest way to get your results is to register for My Chart.   IF you received an x-ray today, you will receive an invoice from [facility]. Please contact [facility] at [phone] with questions or concerns regarding your invoice.   IF  you received labwork today, you will receive an invoice from [facility]. Please contact [facility] at [phone] with questions or concerns regarding your invoice.   Our billing staff will not be able to assist you with questions regarding bills from these companies.  You will be contacted with the lab results as soon as they are available. The fastest way to get your results is to activate your My Chart account. Instructions are located on the last page of this paperwork. If you have not heard from [place] regarding the results in 2 weeks, please contact this office.      Sinusitis, Adult Sinusitis is soreness and inflammation of your sinuses. Sinuses are hollow spaces in the bones around your face. They are located:  Around your eyes.  In the middle of your forehead.  Behind your nose.  In your cheekbones.  Your sinuses and nasal passages are lined with a stringy fluid (mucus). Mucus normally drains out of your sinuses. When your nasal tissues get inflamed or swollen, the mucus can get trapped or blocked so air cannot flow through your sinuses. This lets bacteria, viruses, and funguses grow, and that leads to  infection. Follow these instructions at home: Medicines  Take, use, or apply over-the-counter and prescription medicines only as told by your doctor. These may include nasal sprays.  If you were prescribed an antibiotic medicine, take it as told by your doctor. Do not stop taking the antibiotic even if you start to feel better. Hydrate and Humidify  Drink enough water to keep your pee (urine) clear or pale yellow.  Use a cool mist humidifier to keep the humidity level in your home above 50%.  Breathe in steam for 10-15 minutes, 3-4 times a day or as told by your doctor. You can do this in the bathroom while a hot shower is running.  Try not to spend time in cool or dry air. Rest  Rest as much as possible.  Sleep with your head raised (elevated).  Make sure to get enough sleep each night. General instructions  Put a warm, moist washcloth on your face 3-4 times a day or as told by your doctor. This will help with discomfort.  Wash your hands often with soap and water. If there is no soap and water, use hand sanitizer.  Do not smoke. Avoid being around people who are smoking (secondhand smoke).  Keep all follow-up visits as told by your doctor. This is important. Contact a doctor if:  You have a fever.  Your symptoms get worse.  Your symptoms do not get better within 10 days. Get help right away if:  You have a very bad headache.  You cannot stop throwing up (vomiting).  You have pain or swelling around your face or eyes.  You have trouble seeing.  You feel confused.  Your neck is stiff.  You have trouble breathing. This information is not intended to replace advice given to you by your health care provider. Make sure you discuss any questions you have with your health care provider. Document Released: 06/17/2007 Document Revised: 08/25/2015 Document Reviewed: 10/24/2014 Elsevier Interactive Patient Education  2018 [employer] Inc[employer]      Agustina Caroli,  MD [facility]

## 2018-02-03 ENCOUNTER — Encounter: Payer: Self-pay | Admitting: Internal Medicine

## 2018-02-03 ENCOUNTER — Other Ambulatory Visit: Payer: Self-pay

## 2018-02-03 ENCOUNTER — Ambulatory Visit (INDEPENDENT_AMBULATORY_CARE_PROVIDER_SITE_OTHER): Payer: Medicare Other | Admitting: Internal Medicine

## 2018-02-03 VITALS — BP 160/82 | HR 68 | Temp 98.0°F | Resp 16 | Ht 67.0 in | Wt 170.0 lb

## 2018-02-03 DIAGNOSIS — H6590 Unspecified nonsuppurative otitis media, unspecified ear: Secondary | ICD-10-CM | POA: Diagnosis not present

## 2018-02-03 DIAGNOSIS — I10 Essential (primary) hypertension: Secondary | ICD-10-CM

## 2018-02-03 DIAGNOSIS — H6593 Unspecified nonsuppurative otitis media, bilateral: Secondary | ICD-10-CM

## 2018-02-03 NOTE — Progress Notes (Signed)
Subjective:     Patient ID: Charlene Cox , female    DOB: 04-24-1955 , 63 y.o.   MRN: [id number]   Chief Complaint  Patient presents with  . Establish Care    pt states she needs a pcp  . Otalgia    x 2 weeks     HPI  Pt present today to get established with new PCP since her prior one moved to [facility].  Has been having fullness both ears x 2 weeks with intermittent ringing. Has been taking a vitamin supplements for ringing which seems to be helping her. Has been watching her grand kids who have had the flu. She cant recall when she developed post nasal drainage and mild cough.    Has hx of HTN and has been forgetting to take her HTN med and re-started this 3 days ago. She also admits of taking Sudafed qd.   Past Medical History:  Diagnosis Date  . Allergy   . Anxiety   . Arthritis   . Chronic low back pain   . HTN (hypertension) 03/28/2016  . Osteoarthritis of left hip    severe determined by x-ray     History reviewed. No pertinent family history.   Current Outpatient Medications:  .  fluticasone (FLONASE) 50 MCG/ACT nasal spray, Place 2 sprays into both nostrils daily., Disp: 16 g, Rfl: 11 .  lisinopril-hydrochlorothiazide (PRINZIDE,ZESTORETIC) 10-12.5 MG tablet, Take 1 tablet by mouth daily., Disp: 90 tablet, Rfl: 3 .  Olopatadine HCl 0.2 % SOLN, Apply 1 drop to eye daily., Disp: 2.5 mL, Rfl: 1   No Known Allergies   Review of Systems  Constitutional: Negative for chills, diaphoresis and fever.  HENT: Positive for postnasal drip and tinnitus. Negative for congestion, ear discharge, ear pain, rhinorrhea, sinus pressure, sinus pain, sore throat and voice change.   Respiratory: Negative for cough, chest tightness and shortness of breath.   Cardiovascular: Negative for chest pain, palpitations and leg swelling.  Gastrointestinal: Positive for nausea.  Musculoskeletal: Negative for gait problem.  Neurological: Negative for dizziness, speech difficulty, weakness,  light-headedness and headaches.   Today's Vitals   02/03/18 1053  BP: (!) 160/82  Pulse: 68  Resp: 16  Temp: 98 F (36.7 C)  TempSrc: Oral  SpO2: 97%  Weight: 170 lb (77.1 kg)  Height: 5\' 7"  (1.702 m)   Body mass index is 26.63 kg/m.   Objective:  Physical Exam Vitals signs and nursing note reviewed.  Constitutional:      General: She is not in acute distress.    Appearance: Normal appearance. She is not toxic-appearing.  HENT:     Head: Normocephalic.     Right Ear: Ear canal and external ear normal.     Left Ear: Ear canal and external ear normal.     Ears:     Comments: Both TMs gray, flat and dull    Nose: Nose normal. No congestion or rhinorrhea.     Mouth/Throat:     Mouth: Mucous membranes are moist.     Pharynx: Oropharynx is clear.  Eyes:     General: No scleral icterus.       Right eye: No discharge.        Left eye: No discharge.     Conjunctiva/sclera: Conjunctivae normal.  Neck:     Musculoskeletal: Neck supple. No neck rigidity.  Cardiovascular:     Rate and Rhythm: Normal rate and regular rhythm.     Heart sounds: No murmur.  Pulmonary:     Effort: Pulmonary effort is normal.     Breath sounds: Normal breath sounds.  Musculoskeletal: Normal range of motion.  Lymphadenopathy:     Cervical: No cervical adenopathy.  Skin:    General: Skin is warm and dry.  Neurological:     Mental Status: She is alert and oriented to person, place, and time.     Motor: No weakness.     Gait: Gait normal.  Psychiatric:        Mood and Affect: Mood normal.        Behavior: Behavior normal.        Thought Content: Thought content normal.     Assessment And Plan:    1. Bilateral serous otitis media, unspecified chronicity- acute  2. Serous otitis media, unspecified chronicity, unspecified lateralit- acute. Advised to use Flonase 2 sprays each nostril x 7-10 days.   3. Essential hypertension- not well controlled. Advised to d/c sudafed and do BP diaries. She  declined doing labs today, since she just received vitamin infusion this week and does not want to get poked on the same area, til it is healed. She will come next week for HTN FU and labs.     Vivian Neuwirth RODRIGUEZ-SOUTHWORTH, PA-C

## 2018-02-03 NOTE — Patient Instructions (Addendum)
Use Flonase 2 sprays each nose for 7-10 days    If you have lab work done today you will be contacted with your lab results within the next 2 weeks.  If you have not heard from [place] then please contact us. The fastest way to get your results is to register for My Chart.   IF you received an x-ray today, you will receive an invoice from [facility]. Please contact [facility] at [phone] with questions or concerns regarding your invoice.   IF you received labwork today, you will receive an invoice from [facility]. Please contact [facility] at [phone] with questions or concerns regarding your invoice.   Our billing staff will not be able to assist you with questions regarding bills from these companies.  You will be contacted with the lab results as soon as they are available. The fastest way to get your results is to activate your My Chart account. Instructions are located on the last page of this paperwork. If you have not heard from [place] regarding the results in 2 weeks, please contact this office.      Eustachian Tube Dysfunction  Eustachian tube dysfunction refers to a condition in which a blockage develops in the narrow passage that connects the middle ear to the back of the nose (eustachian tube). The eustachian tube regulates air pressure in the middle ear by letting air move between the ear and nose. It also helps to drain fluid from the middle ear space. Eustachian tube dysfunction can affect one or both ears. When the eustachian tube does not function properly, air pressure, fluid, or both can build up in the middle ear. What are the causes? This condition occurs when the eustachian tube becomes blocked or cannot open normally. Common causes of this condition include:  Ear infections.  Colds and other infections that affect the nose, mouth, and throat (upper respiratory tract).  Allergies.  Irritation from cigarette smoke.  Irritation from stomach acid  coming up into the esophagus (gastroesophageal reflux). The esophagus is the tube that carries food from the mouth to the stomach.  Sudden changes in air pressure, such as from descending in an airplane or scuba diving.  Abnormal growths in the nose or throat, such as: ? Growths that line the nose (nasal polyps). ? Abnormal growth of cells (tumors). ? Enlarged tissue at the back of the throat (adenoids). What increases the risk? You are more likely to develop this condition if:  You smoke.  You are overweight.  You are a child who has: ? Certain birth defects of the mouth, such as cleft palate. ? Large tonsils or adenoids. What are the signs or symptoms? Common symptoms of this condition include:  A feeling of fullness in the ear.  Ear pain.  Clicking or popping noises in the ear.  Ringing in the ear.  Hearing loss.  Loss of balance.  Dizziness. Symptoms may get worse when the air pressure around you changes, such as when you travel to an area of high elevation, fly on an airplane, or go scuba diving. How is this diagnosed? This condition may be diagnosed based on:  Your symptoms.  A physical exam of your ears, nose, and throat.  Tests, such as those that measure: ? The movement of your eardrum (tympanogram). ? Your hearing (audiometry). How is this treated? Treatment depends on the cause and severity of your condition.  In mild cases, you may relieve your symptoms by moving air into your ears. This is  called "popping the ears."  In more severe cases, or if you have symptoms of fluid in your ears, treatment may include: ? Medicines to relieve congestion (decongestants). ? Medicines that treat allergies (antihistamines). ? Nasal sprays or ear drops that contain medicines that reduce swelling (steroids). ? A procedure to drain the fluid in your eardrum (myringotomy). In this procedure, a small tube is placed in the eardrum to:  Drain the fluid.  Restore the air  in the middle ear space. ? A procedure to insert a balloon device through the nose to inflate the opening of the eustachian tube (balloon dilation). Follow these instructions at home: Lifestyle  Do not do any of the following until your health care provider approves: ? Travel to high altitudes. ? Fly in airplanes. ? Work in a Pension scheme manager or room. ? Scuba dive.  Do not use any products that contain nicotine or tobacco, such as cigarettes and e-cigarettes. If you need help quitting, ask your health care provider.  Keep your ears dry. Wear fitted earplugs during showering and bathing. Dry your ears completely after. General instructions  Take over-the-counter and prescription medicines only as told by your health care provider.  Use techniques to help pop your ears as recommended by your health care provider. These may include: ? Chewing gum. ? Yawning. ? Frequent, forceful swallowing. ? Closing your mouth, holding your nose closed, and gently blowing as if you are trying to blow air out of your nose.  Keep all follow-up visits as told by your health care provider. This is important. Contact a health care provider if:  Your symptoms do not go away after treatment.  Your symptoms come back after treatment.  You are unable to pop your ears.  You have: ? A fever. ? Pain in your ear. ? Pain in your head or neck. ? Fluid draining from your ear.  Your hearing suddenly changes.  You become very dizzy.  You lose your balance. Summary  Eustachian tube dysfunction refers to a condition in which a blockage develops in the eustachian tube.  It can be caused by ear infections, allergies, inhaled irritants, or abnormal growths in the nose or throat.  Symptoms include ear pain, hearing loss, or ringing in the ears.  Mild cases are treated with maneuvers to unblock the ears, such as yawning or ear popping.  Severe cases are treated with medicines. Surgery may also be done  (rare). This information is not intended to replace advice given to you by your health care provider. Make sure you discuss any questions you have with your health care provider. Document Released: 01/25/2015 Document Revised: 04/20/2017 Document Reviewed: 04/20/2017 Elsevier Interactive Patient Education  2019 [employer].

## 2018-02-14 ENCOUNTER — Ambulatory Visit: Payer: Medicare Other | Admitting: Internal Medicine

## 2018-03-25 ENCOUNTER — Telehealth: Payer: Self-pay | Admitting: Internal Medicine

## 2018-03-25 NOTE — Telephone Encounter (Signed)
Called to schedule Medicare Annual Wellness Visit with the Nurse Health Advisor. No answer.  Left message on machine to call Lattie Haw at [phone]. If patient returns call, please schedule a Welcome to Medicare with Corie Chiquito.  Patient is eligible per [facility] till 0101/2021.  Thank you! For any questions please contact: Janace Hoard at [phone] or Skype [email or web address]

## 2018-04-20 NOTE — Telephone Encounter (Signed)
I left a message asking the pt to call me at [phone] to schedule her Welcome to Medicare visit with Sunday Spillers in June or July possibly (elig before 01/13/2019). VDM (DD)

## 2018-05-05 NOTE — Telephone Encounter (Signed)
Left another message asking the patient to call and scheduled Welcome to Medicare visit with Sunday Spillers. VDM (DD)

## 2018-06-30 ENCOUNTER — Ambulatory Visit: Payer: Medicare Other | Admitting: Internal Medicine

## 2018-07-01 ENCOUNTER — Telehealth: Payer: Self-pay

## 2018-07-01 NOTE — Telephone Encounter (Signed)
LVM for pt to call the office to reschedule missed appt 07/01/2018

## 2018-07-13 HISTORY — PX: COSMETIC SURGERY: SHX468

## 2018-11-07 ENCOUNTER — Telehealth: Payer: Self-pay | Admitting: Internal Medicine

## 2018-11-07 NOTE — Telephone Encounter (Signed)
I left a message asking the pt to call and schedule Welcome to Medicare visit that's due with Charlene Cox before the end of the year.

## 2018-11-18 ENCOUNTER — Telehealth: Payer: Self-pay | Admitting: Internal Medicine

## 2018-11-18 NOTE — Telephone Encounter (Signed)
I left a message asking the pt to call and schedule appt. °

## 2019-01-19 ENCOUNTER — Ambulatory Visit (INDEPENDENT_AMBULATORY_CARE_PROVIDER_SITE_OTHER): Payer: Medicare Other | Admitting: Nurse Practitioner

## 2019-01-19 ENCOUNTER — Encounter: Payer: Self-pay | Admitting: Nurse Practitioner

## 2019-01-19 ENCOUNTER — Other Ambulatory Visit: Payer: Self-pay

## 2019-01-19 ENCOUNTER — Ambulatory Visit (INDEPENDENT_AMBULATORY_CARE_PROVIDER_SITE_OTHER): Payer: Medicare Other

## 2019-01-19 VITALS — BP 158/70 | HR 67 | Temp 98.9°F | Ht 67.0 in | Wt 174.0 lb

## 2019-01-19 VITALS — BP 158/70 | HR 65 | Temp 98.9°F | Ht 67.0 in | Wt 174.2 lb

## 2019-01-19 DIAGNOSIS — I10 Essential (primary) hypertension: Secondary | ICD-10-CM | POA: Diagnosis not present

## 2019-01-19 DIAGNOSIS — Z23 Encounter for immunization: Secondary | ICD-10-CM

## 2019-01-19 DIAGNOSIS — E2839 Other primary ovarian failure: Secondary | ICD-10-CM

## 2019-01-19 DIAGNOSIS — Z0000 Encounter for general adult medical examination without abnormal findings: Secondary | ICD-10-CM | POA: Diagnosis not present

## 2019-01-19 DIAGNOSIS — J0100 Acute maxillary sinusitis, unspecified: Secondary | ICD-10-CM

## 2019-01-19 DIAGNOSIS — Z20828 Contact with and (suspected) exposure to other viral communicable diseases: Secondary | ICD-10-CM | POA: Diagnosis not present

## 2019-01-19 LAB — POCT URINALYSIS DIPSTICK
Bilirubin, UA: NEGATIVE
Blood, UA: NEGATIVE
Glucose, UA: NEGATIVE
Leukocytes, UA: NEGATIVE
Nitrite, UA: NEGATIVE
Protein, UA: NEGATIVE
Spec Grav, UA: 1.025 (ref 1.010–1.025)
Urobilinogen, UA: 0.2 E.U./dL
pH, UA: 5.5 (ref 5.0–8.0)

## 2019-01-19 LAB — POCT UA - MICROALBUMIN
Albumin/Creatinine Ratio, Urine, POC: <30
Creatinine, POC: 300 mg/dL
Microalbumin Ur, POC: 30 mg/L

## 2019-01-19 MED ORDER — LISINOPRIL-HYDROCHLOROTHIAZIDE 10-12.5 MG PO TABS: 1.0000 | ORAL_TABLET | Freq: Every day | ORAL | 1 refills | Status: DC

## 2019-01-19 MED ORDER — HYDROCODONE-HOMATROPINE 5-1.5 MG/5ML PO SYRP: 5.0000 mL | ORAL_SOLUTION | Freq: Four times a day (QID) | ORAL | 0 refills | Status: DC | PRN

## 2019-01-19 MED ORDER — AMOXICILLIN-POT CLAVULANATE 875-125 MG PO TABS: 1.0000 | ORAL_TABLET | Freq: Two times a day (BID) | ORAL | 0 refills | Status: AC

## 2019-01-19 MED ORDER — BOOSTRIX 5-2.5-18.5 LF-MCG/0.5 IM SUSP: 0.5000 mL | Freq: Once | INTRAMUSCULAR | 0 refills | Status: AC

## 2019-01-19 MED ORDER — ALBUTEROL SULFATE HFA 108 (90 BASE) MCG/ACT IN AERS: 2.0000 | INHALATION_SPRAY | Freq: Four times a day (QID) | RESPIRATORY_TRACT | 1 refills | Status: DC | PRN

## 2019-01-19 MED ORDER — TRIAMCINOLONE ACETONIDE 55 MCG/ACT NA AERO: 1.0000 | INHALATION_SPRAY | Freq: Two times a day (BID) | NASAL | 2 refills | Status: DC

## 2019-01-19 NOTE — Progress Notes (Signed)
This visit occurred during the SARS-CoV-2 public health emergency.  Safety protocols were in place, including screening questions prior to the visit, additional usage of staff PPE, and extensive cleaning of exam room while observing appropriate contact time as indicated for disinfecting solutions.  Subjective:   Charlene Cox is a 64 y.o. female who presents for an Initial Medicare Annual Wellness Visit.  Review of Systems    n/a  Cardiac Risk Factors include: advanced age (>80men, >62 women);hypertension;sedentary lifestyle     Objective:    Today's Vitals   01/19/19 1412  BP: (!) 158/70  Pulse: 65  Temp: 98.9 F (37.2 C)  TempSrc: Oral  SpO2: 99%  Weight: 174 lb 3.2 oz (79 kg)  Height: 5\' 7"  (1.702 m)   Body mass index is 27.28 kg/m.  Advanced Directives 01/19/2019  Does Patient Have a Medical Advance Directive? No  Would patient like information on creating a medical advance directive? Yes (MAU/Ambulatory/Procedural Areas - Information given)    Current Medications (verified) Outpatient Encounter Medications as of 01/19/2019  Medication Sig  . fluticasone (FLONASE) 50 MCG/ACT nasal spray Place 2 sprays into both nostrils daily.  Marland Kitchen lisinopril-hydrochlorothiazide (PRINZIDE,ZESTORETIC) 10-12.5 MG tablet Take 1 tablet by mouth daily.  . Olopatadine HCl 0.2 % SOLN Apply 1 drop to eye daily. (Patient not taking: Reported on 01/19/2019)   No facility-administered encounter medications on file as of 01/19/2019.    Allergies (verified) Patient has no known allergies.   History: Past Medical History:  Diagnosis Date  . Allergy   . Anxiety   . Arthritis   . Chronic low back pain   . HTN (hypertension) 03/28/2016  . Osteoarthritis of left hip    severe determined by x-ray   Past Surgical History:  Procedure Laterality Date  . COSMETIC SURGERY  07/2018   tummy tuck and facelift   History reviewed. No pertinent family history. Social History   Socioeconomic History    . Marital status: Married    Spouse name: Not on file  . Number of children: Not on file  . Years of education: Not on file  . Highest education level: Not on file  Occupational History  . Occupation: semi retired  Tobacco Use  . Smoking status: Never Smoker  . Smokeless tobacco: Never Used  Substance and Sexual Activity  . Alcohol use: No    Alcohol/week: 0.0 standard drinks  . Drug use: No  . Sexual activity: Not Currently  Other Topics Concern  . Not on file  Social History Narrative  . Not on file   Social Determinants of Health   Financial Resource Strain: Low Risk   . Difficulty of Paying Living Expenses: Not hard at all  Food Insecurity: No Food Insecurity  . Worried About Charity fundraiser in the Last Year: Never true  . Ran Out of Food in the Last Year: Never true  Transportation Needs: No Transportation Needs  . Lack of Transportation (Medical): No  . Lack of Transportation (Non-Medical): No  Physical Activity: Inactive  . Days of Exercise per Week: 0 days  . Minutes of Exercise per Session: 0 min  Stress: No Stress Concern Present  . Feeling of Stress : Not at all  Social Connections:   . Frequency of Communication with Friends and Family: Not on file  . Frequency of Social Gatherings with Friends and Family: Not on file  . Attends Religious Services: Not on file  . Active Member of Clubs or Organizations: Not on  file  . Attends Archivist Meetings: Not on file  . Marital Status: Not on file    Tobacco Counseling Counseling given: Not Answered   Clinical Intake:  Pre-visit preparation completed: Yes  Pain : No/denies pain     Nutritional Status: BMI 25 -29 Overweight Nutritional Risks: Nausea/ vomitting/ diarrhea Diabetes: No  How often do you need to have someone help you when you read instructions, pamphlets, or other written materials from your doctor or pharmacy?: 1 - Never What is the last grade level you completed in school?:  some college  Interpreter Needed?: No  Information entered by :: NAllen LPN   Activities of Daily Living In your present state of health, do you have any difficulty performing the following activities: 01/19/2019  Hearing? N  Comment occassional tinnitus  Vision? N  Difficulty concentrating or making decisions? N  Walking or climbing stairs? N  Dressing or bathing? N  Doing errands, shopping? N  Preparing Food and eating ? N  Using the Toilet? N  In the past six months, have you accidently leaked urine? N  Do you have problems with loss of bowel control? N  Managing your Medications? N  Managing your Finances? N  Housekeeping or managing your Housekeeping? N  Some recent data might be hidden     Immunizations and Health Maintenance  There is no immunization history on file for this patient. Health Maintenance Due  Topic Date Due  . Hepatitis C Screening  02/22/1955  . HIV Screening  02/13/1968  . TETANUS/TDAP  02/13/1972  . PAP SMEAR-Modifier  02/12/1974  . COLONOSCOPY  02/13/2003  . MAMMOGRAM  08/29/2017  . DEXA SCAN  02/12/2018  . PNA vac Low Risk Adult (1 of 2 - PCV13) 02/12/2018  . INFLUENZA VACCINE  08/13/2018    Patient Care Team: Rodriguez-[place], Sunday Spillers, PA-C as PCP - General (Internal Medicine)  Indicate any recent Medical Services you may have received from other than [facility] providers in the past year (date may be approximate).     Assessment:   This is a routine wellness examination for Charlene Cox.  Hearing/Vision screen  Hearing Screening   125Hz  250Hz  500Hz  1000Hz  2000Hz  3000Hz  4000Hz  6000Hz  8000Hz   Right ear:           Left ear:           Vision Screening Comments: Annual eye exams  Dietary issues and exercise activities discussed: Current Exercise Habits: The patient does not participate in regular exercise at present  Goals    . Patient Stated     01/19/2019, wants to start working out in the home gym      Depression Screen PHQ 2/9 Scores  01/19/2019 02/03/2018 08/25/2017 12/24/2016 10/16/2016 03/28/2016 03/13/2016  PHQ - 2 Score 0 0 0 0 0 0 0  PHQ- 9 Score 0 - - - - - -    Fall Risk Fall Risk  01/19/2019 02/03/2018 08/25/2017 10/16/2016 03/28/2016  Falls in the past year? 0 0 No No No  Injury with Fall? - 0 - - -  Risk for fall due to : Medication side effect - - - -  Follow up Falls evaluation completed;Education provided;Falls prevention discussed - - - -    Is the patient's home free of loose throw rugs in walkways, pet beds, electrical cords, etc?   yes      Grab bars in the bathroom? no      Handrails on the stairs?   yes  Adequate lighting?   yes  Timed Get Up and Go Performed n/a  Cognitive Function:     6CIT Screen 01/19/2019  What Year? 0 points  What month? 0 points  What time? 0 points  Count back from 20 0 points  Months in reverse 0 points  Repeat phrase 0 points  Total Score 0    Screening Tests Health Maintenance  Topic Date Due  . Hepatitis C Screening  05-Jul-1955  . HIV Screening  02/13/1968  . TETANUS/TDAP  02/13/1972  . PAP SMEAR-Modifier  02/12/1974  . COLONOSCOPY  02/13/2003  . MAMMOGRAM  08/29/2017  . DEXA SCAN  02/12/2018  . PNA vac Low Risk Adult (1 of 2 - PCV13) 02/12/2018  . INFLUENZA VACCINE  08/13/2018    Qualifies for Shingles Vaccine? yes  Cancer Screenings: Lung: Low Dose CT Chest recommended if Age 66-80 years, 30 pack-year currently smoking OR have quit w/in 15years. Patient does not qualify. Breast: Up to date on Mammogram? Yes   Up to date of Bone Density/Dexa? No Colorectal: wants cologuard  Additional Screenings:  Hepatitis C Screening: declined     Plan:    Patient plans to start working out in her home gym.  I have personally reviewed and noted the following in the patient's chart:   . Medical and social history . Use of alcohol, tobacco or illicit drugs  . Current medications and supplements . Functional ability and status . Nutritional status . Physical  activity . Advanced directives . List of other physicians . Hospitalizations, surgeries, and ER visits in previous 12 months . Vitals . Screenings to include cognitive, depression, and falls . Referrals and appointments  In addition, I have reviewed and discussed with patient certain preventive protocols, quality metrics, and best practice recommendations. A written personalized care plan for preventive services as well as general preventive health recommendations were provided to patient.     Kellie Simmering, LPN   [phone]

## 2019-01-19 NOTE — Patient Instructions (Signed)
Charlene Cox , Thank you for taking time to come for your Medicare Wellness Visit. I appreciate your ongoing commitment to your health goals. Please review the following plan we discussed and let me know if I can assist you in the future.   Screening recommendations/referrals: Colonoscopy: wants cologuard Mammogram: working on scheduling Bone Density: referred Recommended yearly ophthalmology/optometry visit for glaucoma screening and checkup Recommended yearly dental visit for hygiene and checkup  Vaccinations: Influenza vaccine: decline for now Pneumococcal vaccine: decline for now Tdap vaccine: sent to pharmacy Shingles vaccine: discussed    Advanced directives: Advance directive discussed with you today. I have provided a copy for you to complete at home and have notarized. Once this is complete please bring a copy in to our office so we can scan it into your chart.   Conditions/risks identified: overweight  Next appointment:    Preventive Care 23 Years and Older, Female Preventive care refers to lifestyle choices and visits with your health care provider that can promote health and wellness. What does preventive care include?  A yearly physical exam. This is also called an annual well check.  Dental exams once or twice a year.  Routine eye exams. Ask your health care provider how often you should have your eyes checked.  Personal lifestyle choices, including:  Daily care of your teeth and gums.  Regular physical activity.  Eating a healthy diet.  Avoiding tobacco and drug use.  Limiting alcohol use.  Practicing safe sex.  Taking low-dose aspirin every day.  Taking vitamin and mineral supplements as recommended by your health care provider. What happens during an annual well check? The services and screenings done by your health care provider during your annual well check will depend on your age, overall health, lifestyle risk factors, and family history of  disease. Counseling  Your health care provider may ask you questions about your:  Alcohol use.  Tobacco use.  Drug use.  Emotional well-being.  Home and relationship well-being.  Sexual activity.  Eating habits.  History of falls.  Memory and ability to understand (cognition).  Work and work Statistician.  Reproductive health. Screening  You may have the following tests or measurements:  Height, weight, and BMI.  Blood pressure.  Lipid and cholesterol levels. These may be checked every 5 years, or more frequently if you are over 29 years old.  Skin check.  Lung cancer screening. You may have this screening every year starting at age 55 if you have a 30-pack-year history of smoking and currently smoke or have quit within the past 15 years.  Fecal occult blood test (FOBT) of the stool. You may have this test every year starting at age 46.  Flexible sigmoidoscopy or colonoscopy. You may have a sigmoidoscopy every 5 years or a colonoscopy every 10 years starting at age 64.  Hepatitis C blood test.  Hepatitis B blood test.  Sexually transmitted disease (STD) testing.  Diabetes screening. This is done by checking your blood sugar (glucose) after you have not eaten for a while (fasting). You may have this done every 1-3 years.  Bone density scan. This is done to screen for osteoporosis. You may have this done starting at age 42.  Mammogram. This may be done every 1-2 years. Talk to your health care provider about how often you should have regular mammograms. Talk with your health care provider about your test results, treatment options, and if necessary, the need for more tests. Vaccines  Your health care provider  may recommend certain vaccines, such as:  Influenza vaccine. This is recommended every year.  Tetanus, diphtheria, and acellular pertussis (Tdap, Td) vaccine. You may need a Td booster every 10 years.  Zoster vaccine. You may need this after age  83.  Pneumococcal 13-valent conjugate (PCV13) vaccine. One dose is recommended after age 14.  Pneumococcal polysaccharide (PPSV23) vaccine. One dose is recommended after age 78. Talk to your health care provider about which screenings and vaccines you need and how often you need them. This information is not intended to replace advice given to you by your health care provider. Make sure you discuss any questions you have with your health care provider. Document Released: 01/25/2015 Document Revised: 09/18/2015 Document Reviewed: 10/30/2014 Elsevier Interactive Patient Education  2017 [place] Prevention in the Home Falls can cause injuries. They can happen to people of all ages. There are many things you can do to make your home safe and to help prevent falls. What can I do on the outside of my home?  Regularly fix the edges of walkways and driveways and fix any cracks.  Remove anything that might make you trip as you walk through a door, such as a raised step or threshold.  Trim any bushes or trees on the path to your home.  Use bright outdoor lighting.  Clear any walking paths of anything that might make someone trip, such as rocks or tools.  Regularly check to see if handrails are loose or broken. Make sure that both sides of any steps have handrails.  Any raised decks and porches should have guardrails on the edges.  Have any leaves, snow, or ice cleared regularly.  Use sand or salt on walking paths during winter.  Clean up any spills in your garage right away. This includes oil or grease spills. What can I do in the bathroom?  Use night lights.  Install grab bars by the toilet and in the tub and shower. Do not use towel bars as grab bars.  Use non-skid mats or decals in the tub or shower.  If you need to sit down in the shower, use a plastic, non-slip stool.  Keep the floor dry. Clean up any water that spills on the floor as soon as it happens.  Remove  soap buildup in the tub or shower regularly.  Attach bath mats securely with double-sided non-slip rug tape.  Do not have throw rugs and other things on the floor that can make you trip. What can I do in the bedroom?  Use night lights.  Make sure that you have a light by your bed that is easy to reach.  Do not use any sheets or blankets that are too big for your bed. They should not hang down onto the floor.  Have a firm chair that has side arms. You can use this for support while you get dressed.  Do not have throw rugs and other things on the floor that can make you trip. What can I do in the kitchen?  Clean up any spills right away.  Avoid walking on wet floors.  Keep items that you use a lot in easy-to-reach places.  If you need to reach something above you, use a strong step stool that has a grab bar.  Keep electrical cords out of the way.  Do not use floor polish or wax that makes floors slippery. If you must use wax, use non-skid floor wax.  Do not have throw rugs  and other things on the floor that can make you trip. What can I do with my stairs?  Do not leave any items on the stairs.  Make sure that there are handrails on both sides of the stairs and use them. Fix handrails that are broken or loose. Make sure that handrails are as long as the stairways.  Check any carpeting to make sure that it is firmly attached to the stairs. Fix any carpet that is loose or worn.  Avoid having throw rugs at the top or bottom of the stairs. If you do have throw rugs, attach them to the floor with carpet tape.  Make sure that you have a light switch at the top of the stairs and the bottom of the stairs. If you do not have them, ask someone to add them for you. What else can I do to help prevent falls?  Wear shoes that:  Do not have high heels.  Have rubber bottoms.  Are comfortable and fit you well.  Are closed at the toe. Do not wear sandals.  If you use a  stepladder:  Make sure that it is fully opened. Do not climb a closed stepladder.  Make sure that both sides of the stepladder are locked into place.  Ask someone to hold it for you, if possible.  Clearly mark and make sure that you can see:  Any grab bars or handrails.  First and last steps.  Where the edge of each step is.  Use tools that help you move around (mobility aids) if they are needed. These include:  Canes.  Walkers.  Scooters.  Crutches.  Turn on the lights when you go into a dark area. Replace any light bulbs as soon as they burn out.  Set up your furniture so you have a clear path. Avoid moving your furniture around.  If any of your floors are uneven, fix them.  If there are any pets around you, be aware of where they are.  Review your medicines with your doctor. Some medicines can make you feel dizzy. This can increase your chance of falling. Ask your doctor what other things that you can do to help prevent falls. This information is not intended to replace advice given to you by your health care provider. Make sure you discuss any questions you have with your health care provider. Document Released: 10/25/2008 Document Revised: 06/06/2015 Document Reviewed: 02/02/2014 Elsevier Interactive Patient Education  2017 [employer].

## 2019-01-19 NOTE — Progress Notes (Addendum)
This visit occurred during the SARS-CoV-2 public health emergency.  Safety protocols were in place, including screening questions prior to the visit, additional usage of staff PPE, and extensive cleaning of exam room while observing appropriate contact time as indicated for disinfecting solutions.  Subjective:     Patient ID: Charlene Cox , female    DOB: 03/05/1955 , 64 y.o.   MRN: [id number]   Chief Complaint  Patient presents with  . Hypertension    HPI  Hypertension This is a chronic problem. The current episode started more than 1 year ago. The problem is unchanged. The problem is controlled. Pertinent negatives include no anxiety, blurred vision, chest pain, headaches, neck pain, palpitations or shortness of breath. There are no associated agents to hypertension. Risk factors for coronary artery disease include sedentary lifestyle.  Sinus Problem This is a new problem. The current episode started in the past 7 days (2 days ago). The problem has been gradually improving since onset. There has been no fever. She is experiencing no pain. Associated symptoms include coughing and sinus pressure. Pertinent negatives include no chills, congestion, ear pain, headaches, neck pain, shortness of breath or sore throat. Past treatments include nothing. The treatment provided no relief.     Past Medical History:  Diagnosis Date  . Allergy   . Anxiety   . Arthritis   . Chronic low back pain   . HTN (hypertension) 03/28/2016  . Osteoarthritis of left hip    severe determined by x-ray     History reviewed. No pertinent family history.   Current Outpatient Medications:  .  albuterol (VENTOLIN HFA) 108 (90 Base) MCG/ACT inhaler, Inhale 2 puffs into the lungs every 6 (six) hours as needed for wheezing or shortness of breath., Disp: 6.7 g, Rfl: 1 .  HYDROcodone-homatropine (HYDROMET) 5-1.5 MG/5ML syrup, Take 5 mLs by mouth every 6 (six) hours as needed., Disp: 120 mL, Rfl: 0 .   lisinopril-hydrochlorothiazide (ZESTORETIC) 10-12.5 MG tablet, Take 1 tablet by mouth daily., Disp: 90 tablet, Rfl: 1 .  Olopatadine HCl 0.2 % SOLN, Apply 1 drop to eye daily. (Patient not taking: Reported on 01/19/2019), Disp: 2.5 mL, Rfl: 1 .  triamcinolone (NASACORT) 55 MCG/ACT AERO nasal inhaler, Place 1 spray into the nose 2 (two) times daily., Disp: 1 Inhaler, Rfl: 2   No Known Allergies   Review of Systems  Constitutional: Negative for chills and fatigue.  HENT: Positive for sinus pressure. Negative for congestion, ear pain and sore throat.   Eyes: Negative for blurred vision.  Respiratory: Positive for cough. Negative for shortness of breath.   Cardiovascular: Negative for chest pain and palpitations.  Endocrine: Negative for polydipsia, polyphagia and polyuria.  Musculoskeletal: Negative.  Negative for neck pain.  Neurological: Negative for dizziness and headaches.  Psychiatric/Behavioral: Negative.      Today's Vitals   01/19/19 1459  BP: (!) 158/70  Pulse: 67  Temp: 98.9 F (37.2 C)  TempSrc: Oral  Weight: 174 lb (78.9 kg)  Height: _0  (1.702 m)  PainSc: 0-No pain   Body mass index is 27.25 kg/m.   Objective:  Physical Exam Constitutional:      General: She is not in acute distress.    Appearance: Normal appearance.  HENT:     Right Ear: Tympanic membrane, ear canal and external ear normal. There is no impacted cerumen.     Left Ear: Tympanic membrane, ear canal and external ear normal. There is no impacted cerumen.  Cardiovascular:  Rate and Rhythm: Normal rate and regular rhythm.     Pulses: Normal pulses.     Heart sounds: Normal heart sounds. No murmur.  Pulmonary:     Effort: Pulmonary effort is normal. No respiratory distress.     Breath sounds: Normal breath sounds.  Neurological:     General: No focal deficit present.     Mental Status: She is alert and oriented to person, place, and time.     Cranial Nerves: No cranial nerve deficit.   Psychiatric:        Mood and Affect: Mood normal.        Behavior: Behavior normal.        Thought Content: Thought content normal.        Judgment: Judgment normal.         Assessment And Plan:     1. Essential hypertension . B/P is under fair control.  . CMP ordered to check renal function.  . The importance of regular exercise and dietary modification was stressed to the patient.  - lisinopril-hydrochlorothiazide (ZESTORETIC) 10-12.5 MG tablet; Take 1 tablet by mouth daily.  Dispense: 90 tablet; Refill: 1 - CMP14+EGFR  2. Acute non-recurrent maxillary sinusitis  Will have her checked for covid due to the symptoms  Encouraged to remain self isolated until she has her results   If worsens she is to go to ER for further evaluation - triamcinolone (NASACORT) 55 MCG/ACT AERO nasal inhaler; Place 1 spray into the nose 2 (two) times daily.  Dispense: 1 Inhaler; Refill: 2 - amoxicillin-clavulanate (AUGMENTIN) 875-125 MG tablet; Take 1 tablet by mouth 2 (two) times daily for 7 days.  Dispense: 14 tablet; Refill: 0 - albuterol (VENTOLIN HFA) 108 (90 Base) MCG/ACT inhaler; Inhale 2 puffs into the lungs every 6 (six) hours as needed for wheezing or shortness of breath.  Dispense: 6.7 g; Refill: 1 - Novel Coronavirus, NAA ([facility]) - HYDROcodone-homatropine (HYDROMET) 5-1.5 MG/5ML syrup; Take 5 mLs by mouth every 6 (six) hours as needed.  Dispense: 120 mL; Refill: 0   Minette Brine, FNP    THE PATIENT IS ENCOURAGED TO PRACTICE SOCIAL DISTANCING DUE TO THE COVID-19 PANDEMIC.

## 2019-01-21 ENCOUNTER — Telehealth: Payer: Self-pay | Admitting: Nurse Practitioner

## 2019-01-21 LAB — NOVEL CORONAVIRUS, NAA: SARS-CoV-2, NAA: DETECTED — AB

## 2019-01-21 NOTE — Telephone Encounter (Signed)
Called patient to inform she is currently positive for coronavirus.  No answer will try to call back shortly.

## 2019-01-31 ENCOUNTER — Encounter: Payer: Self-pay | Admitting: Nurse Practitioner

## 2019-02-01 ENCOUNTER — Encounter: Payer: Self-pay | Admitting: Nurse Practitioner

## 2019-02-02 ENCOUNTER — Encounter: Payer: Self-pay | Admitting: Nurse Practitioner

## 2019-02-03 ENCOUNTER — Encounter: Payer: Self-pay | Admitting: Nurse Practitioner

## 2019-02-06 ENCOUNTER — Encounter: Payer: Self-pay | Admitting: Nurse Practitioner

## 2019-02-24 ENCOUNTER — Other Ambulatory Visit: Payer: Self-pay | Admitting: Nurse Practitioner

## 2019-02-24 DIAGNOSIS — Z1231 Encounter for screening mammogram for malignant neoplasm of breast: Secondary | ICD-10-CM

## 2019-04-04 ENCOUNTER — Encounter: Payer: Self-pay | Admitting: Nurse Practitioner

## 2019-04-11 ENCOUNTER — Encounter: Payer: Self-pay | Admitting: Nurse Practitioner

## 2019-04-13 ENCOUNTER — Ambulatory Visit: Payer: Medicare Other | Admitting: Internal Medicine

## 2019-04-19 ENCOUNTER — Ambulatory Visit: Payer: Medicare Other | Admitting: Nurse Practitioner

## 2019-05-15 ENCOUNTER — Other Ambulatory Visit: Payer: Self-pay

## 2019-05-15 ENCOUNTER — Ambulatory Visit: Payer: Self-pay

## 2019-06-08 ENCOUNTER — Other Ambulatory Visit: Payer: Self-pay | Admitting: Nurse Practitioner

## 2019-06-08 DIAGNOSIS — J0100 Acute maxillary sinusitis, unspecified: Secondary | ICD-10-CM

## 2019-07-10 ENCOUNTER — Other Ambulatory Visit: Payer: Self-pay | Admitting: Nurse Practitioner

## 2019-07-10 DIAGNOSIS — I10 Essential (primary) hypertension: Secondary | ICD-10-CM

## 2019-07-14 ENCOUNTER — Ambulatory Visit
Admission: RE | Admit: 2019-07-14 | Discharge: 2019-07-14 | Disposition: A | Discharge status: Home / Self Care | Payer: Medicare Other | Source: Ambulatory Visit | Attending: Nurse Practitioner | Admitting: Nurse Practitioner

## 2019-07-14 ENCOUNTER — Other Ambulatory Visit: Payer: Self-pay

## 2019-07-14 DIAGNOSIS — E2839 Other primary ovarian failure: Secondary | ICD-10-CM

## 2019-07-14 DIAGNOSIS — Z1231 Encounter for screening mammogram for malignant neoplasm of breast: Secondary | ICD-10-CM

## 2019-07-23 ENCOUNTER — Other Ambulatory Visit: Payer: Self-pay | Admitting: Nurse Practitioner

## 2019-07-23 DIAGNOSIS — I10 Essential (primary) hypertension: Secondary | ICD-10-CM

## 2019-09-13 DIAGNOSIS — Z7689 Persons encountering health services in other specified circumstances: Secondary | ICD-10-CM | POA: Diagnosis not present

## 2019-09-13 DIAGNOSIS — R5383 Other fatigue: Secondary | ICD-10-CM | POA: Diagnosis not present

## 2019-09-13 DIAGNOSIS — R232 Flushing: Secondary | ICD-10-CM | POA: Diagnosis not present

## 2019-10-24 ENCOUNTER — Other Ambulatory Visit: Payer: Medicare Other

## 2019-12-13 ENCOUNTER — Telehealth: Payer: Self-pay

## 2019-12-13 NOTE — Telephone Encounter (Signed)
The pt was told that Dr Baird Cancer said that Charlene Cox, fnp[facility] can still be the pt's provider and do her physicals and that Dr Baird Cancer will see her once per year.

## 2019-12-13 NOTE — Telephone Encounter (Signed)
The pt wanted to know if she can switch from [place] be a patient of Dr Baird Cancer.  The pt said that she would like to be her a patient because her husband and niece Thurman Coyer are patients of Dr sanders and they talk very highly of her.

## 2020-01-24 DIAGNOSIS — Z1152 Encounter for screening for COVID-19: Secondary | ICD-10-CM | POA: Diagnosis not present

## 2020-02-06 ENCOUNTER — Other Ambulatory Visit: Payer: Medicare Other

## 2020-02-06 DIAGNOSIS — Z7184 Encounter for health counseling related to travel: Secondary | ICD-10-CM | POA: Diagnosis not present

## 2020-02-06 DIAGNOSIS — L409 Psoriasis, unspecified: Secondary | ICD-10-CM | POA: Diagnosis not present

## 2020-02-08 ENCOUNTER — Ambulatory Visit: Payer: Self-pay | Admitting: Nurse Practitioner

## 2020-02-27 DIAGNOSIS — L738 Other specified follicular disorders: Secondary | ICD-10-CM | POA: Diagnosis not present

## 2020-02-27 DIAGNOSIS — L308 Other specified dermatitis: Secondary | ICD-10-CM | POA: Diagnosis not present

## 2020-03-27 ENCOUNTER — Telehealth: Payer: Self-pay | Admitting: Internal Medicine

## 2020-03-27 NOTE — Telephone Encounter (Signed)
Left message for patient to call back and schedule Medicare Annual Wellness Visit (AWV) either virtually or in office. No detailed message left    Last AWVI  01/19/19  please schedule at anytime with [facility]    This should be a 45 minute visit.

## 2020-05-28 DIAGNOSIS — Z760 Encounter for issue of repeat prescription: Secondary | ICD-10-CM | POA: Diagnosis not present

## 2020-05-28 DIAGNOSIS — E119 Type 2 diabetes mellitus without complications: Secondary | ICD-10-CM | POA: Diagnosis not present

## 2020-06-27 ENCOUNTER — Ambulatory Visit (INDEPENDENT_AMBULATORY_CARE_PROVIDER_SITE_OTHER): Payer: Medicare Other

## 2020-06-27 VITALS — Ht 67.0 in | Wt 172.0 lb

## 2020-06-27 DIAGNOSIS — Z0000 Encounter for general adult medical examination without abnormal findings: Secondary | ICD-10-CM

## 2020-06-27 DIAGNOSIS — E2839 Other primary ovarian failure: Secondary | ICD-10-CM | POA: Diagnosis not present

## 2020-06-27 NOTE — Patient Instructions (Signed)
[name] , Thank you for taking time to come for your Medicare Wellness Visit. I appreciate your ongoing commitment to your health goals. Please review the following plan we discussed and let me know if I can assist you in the future.   Screening recommendations/referrals: Colonoscopy: decline Mammogram: completed 07/14/2019 Bone Density: ordered today Recommended yearly ophthalmology/optometry visit for glaucoma screening and checkup Recommended yearly dental visit for hygiene and checkup  Vaccinations: Influenza vaccine: decline Pneumococcal vaccine: decline Tdap vaccine: due Shingles vaccine: needs second dose   Covid-19: 02/09/2019, 03/09/2019, 11/13/2019  Advanced directives: Advance directive discussed with you today.   Conditions/risks identified: none  Next appointment: Follow up in one year for your annual wellness visit    Preventive Care 65 Years and Older, Female Preventive care refers to lifestyle choices and visits with your health care provider that can promote health and wellness. What does preventive care include? A yearly physical exam. This is also called an annual well check. Dental exams once or twice a year. Routine eye exams. Ask your health care provider how often you should have your eyes checked. Personal lifestyle choices, including: Daily care of your teeth and gums. Regular physical activity. Eating a healthy diet. Avoiding tobacco and drug use. Limiting alcohol use. Practicing safe sex. Taking low-dose aspirin every day. Taking vitamin and mineral supplements as recommended by your health care provider. What happens during an annual well check? The services and screenings done by your health care provider during your annual well check will depend on your age, overall health, lifestyle risk factors, and family history of disease. Counseling  Your health care provider may ask you questions about your: Alcohol use. Tobacco use. Drug  use. Emotional well-being. Home and relationship well-being. Sexual activity. Eating habits. History of falls. Memory and ability to understand (cognition). Work and work Statistician. Reproductive health. Screening  You may have the following tests or measurements: Height, weight, and BMI. Blood pressure. Lipid and cholesterol levels. These may be checked every 5 years, or more frequently if you are over 59 years old. Skin check. Lung cancer screening. You may have this screening every year starting at age 68 if you have a 30-pack-year history of smoking and currently smoke or have quit within the past 15 years. Fecal occult blood test (FOBT) of the stool. You may have this test every year starting at age 16. Flexible sigmoidoscopy or colonoscopy. You may have a sigmoidoscopy every 5 years or a colonoscopy every 10 years starting at age 25. Hepatitis C blood test. Hepatitis B blood test. Sexually transmitted disease (STD) testing. Diabetes screening. This is done by checking your blood sugar (glucose) after you have not eaten for a while (fasting). You may have this done every 1-3 years. Bone density scan. This is done to screen for osteoporosis. You may have this done starting at age 57. Mammogram. This may be done every 1-2 years. Talk to your health care provider about how often you should have regular mammograms. Talk with your health care provider about your test results, treatment options, and if necessary, the need for more tests. Vaccines  Your health care provider may recommend certain vaccines, such as: Influenza vaccine. This is recommended every year. Tetanus, diphtheria, and acellular pertussis (Tdap, Td) vaccine. You may need a Td booster every 10 years. Zoster vaccine. You may need this after age 10. Pneumococcal 13-valent conjugate (PCV13) vaccine. One dose is recommended after age 17. Pneumococcal polysaccharide (PPSV23) vaccine. One dose is recommended after age  67. Talk to your health care provider about which screenings and vaccines you need and how often you need them. This information is not intended to replace advice given to you by your health care provider. Make sure you discuss any questions you have with your health care provider. Document Released: 01/25/2015 Document Revised: 09/18/2015 Document Reviewed: 10/30/2014 Elsevier Interactive Patient Education  2017 [place] Prevention in the Home Falls can cause injuries. They can happen to people of all ages. There are many things you can do to make your home safe and to help prevent falls. What can I do on the outside of my home? Regularly fix the edges of walkways and driveways and fix any cracks. Remove anything that might make you trip as you walk through a door, such as a raised step or threshold. Trim any bushes or trees on the path to your home. Use bright outdoor lighting. Clear any walking paths of anything that might make someone trip, such as rocks or tools. Regularly check to see if handrails are loose or broken. Make sure that both sides of any steps have handrails. Any raised decks and porches should have guardrails on the edges. Have any leaves, snow, or ice cleared regularly. Use sand or salt on walking paths during winter. Clean up any spills in your garage right away. This includes oil or grease spills. What can I do in the bathroom? Use night lights. Install grab bars by the toilet and in the tub and shower. Do not use towel bars as grab bars. Use non-skid mats or decals in the tub or shower. If you need to sit down in the shower, use a plastic, non-slip stool. Keep the floor dry. Clean up any water that spills on the floor as soon as it happens. Remove soap buildup in the tub or shower regularly. Attach bath mats securely with double-sided non-slip rug tape. Do not have throw rugs and other things on the floor that can make you trip. What can I do in the  bedroom? Use night lights. Make sure that you have a light by your bed that is easy to reach. Do not use any sheets or blankets that are too big for your bed. They should not hang down onto the floor. Have a firm chair that has side arms. You can use this for support while you get dressed. Do not have throw rugs and other things on the floor that can make you trip. What can I do in the kitchen? Clean up any spills right away. Avoid walking on wet floors. Keep items that you use a lot in easy-to-reach places. If you need to reach something above you, use a strong step stool that has a grab bar. Keep electrical cords out of the way. Do not use floor polish or wax that makes floors slippery. If you must use wax, use non-skid floor wax. Do not have throw rugs and other things on the floor that can make you trip. What can I do with my stairs? Do not leave any items on the stairs. Make sure that there are handrails on both sides of the stairs and use them. Fix handrails that are broken or loose. Make sure that handrails are as long as the stairways. Check any carpeting to make sure that it is firmly attached to the stairs. Fix any carpet that is loose or worn. Avoid having throw rugs at the top or bottom of the stairs. If you do have throw  rugs, attach them to the floor with carpet tape. Make sure that you have a light switch at the top of the stairs and the bottom of the stairs. If you do not have them, ask someone to add them for you. What else can I do to help prevent falls? Wear shoes that: Do not have high heels. Have rubber bottoms. Are comfortable and fit you well. Are closed at the toe. Do not wear sandals. If you use a stepladder: Make sure that it is fully opened. Do not climb a closed stepladder. Make sure that both sides of the stepladder are locked into place. Ask someone to hold it for you, if possible. Clearly mark and make sure that you can see: Any grab bars or  handrails. First and last steps. Where the edge of each step is. Use tools that help you move around (mobility aids) if they are needed. These include: Canes. Walkers. Scooters. Crutches. Turn on the lights when you go into a dark area. Replace any light bulbs as soon as they burn out. Set up your furniture so you have a clear path. Avoid moving your furniture around. If any of your floors are uneven, fix them. If there are any pets around you, be aware of where they are. Review your medicines with your doctor. Some medicines can make you feel dizzy. This can increase your chance of falling. Ask your doctor what other things that you can do to help prevent falls. This information is not intended to replace advice given to you by your health care provider. Make sure you discuss any questions you have with your health care provider. Document Released: 10/25/2008 Document Revised: 06/06/2015 Document Reviewed: 02/02/2014 Elsevier Interactive Patient Education  2017 [employer].

## 2020-06-27 NOTE — Progress Notes (Addendum)
I connected with  Charlene Cox today via telehealth video enabled device and verified that I am speaking with the correct person using two identifiers.   Location: Patient: home Provider: work  Persons participating in virtual visit: Lovelle Deitrick, Glenna Durand LPN  I discussed the limitations, risks, security and privacy concerns of performing an evaluation and management service by telephone and the availability of in person appointments. The patient expressed understanding and agreed to proceed.   Some vital signs may be absent or patient reported.     Subjective:   Charlene Cox is a 65 y.o. female who presents for Medicare Annual (Subsequent) preventive examination.  Review of Systems     Cardiac Risk Factors include: advanced age (>89men, >76 women);hypertension;sedentary lifestyle     Objective:    Today's Vitals   06/27/20 1158  Weight: 172 lb (78 kg)  Height: 5\' 7"  (1.702 m)   Body mass index is 26.94 kg/m.  Advanced Directives 06/27/2020 01/19/2019  Does Patient Have a Medical Advance Directive? No No  Would patient like information on creating a medical advance directive? - Yes (MAU/Ambulatory/Procedural Areas - Information given)    Current Medications (verified) Outpatient Encounter Medications as of 06/27/2020  Medication Sig   HYDROcodone-homatropine (HYDROMET) 5-1.5 MG/5ML syrup Take 5 mLs by mouth every 6 (six) hours as needed. (Patient not taking: Reported on 06/27/2020)   lisinopril-hydrochlorothiazide (ZESTORETIC) 10-12.5 MG tablet Take 1 tablet by mouth daily. PATIENT NEEDS AN APPOINTMENT (Patient not taking: Reported on 06/27/2020)   Olopatadine HCl 0.2 % SOLN Apply 1 drop to eye daily. (Patient not taking: No sig reported)   triamcinolone (NASACORT) 55 MCG/ACT AERO nasal inhaler Place 1 spray into the nose 2 (two) times daily.   VENTOLIN HFA 108 (90 Base) MCG/ACT inhaler TAKE 2 PUFFS BY MOUTH EVERY 6 HOURS AS NEEDED FOR WHEEZE OR SHORTNESS  OF BREATH (Patient not taking: Reported on 06/27/2020)   No facility-administered encounter medications on file as of 06/27/2020.    Allergies (verified) Patient has no known allergies.   History: Past Medical History:  Diagnosis Date   Allergy    Anxiety    Arthritis    Chronic low back pain    HTN (hypertension) 03/28/2016   Osteoarthritis of left hip    severe determined by x-ray   Past Surgical History:  Procedure Laterality Date   COSMETIC SURGERY  07/2018   tummy tuck and facelift   History reviewed. No pertinent family history. Social History   Socioeconomic History   Marital status: Married    Spouse name: Not on file   Number of children: Not on file   Years of education: Not on file   Highest education level: Not on file  Occupational History   Occupation: semi retired  Tobacco Use   Smoking status: Never   Smokeless tobacco: Never  Vaping Use   Vaping Use: Never used  Substance and Sexual Activity   Alcohol use: No    Alcohol/week: 0.0 standard drinks   Drug use: No   Sexual activity: Not Currently  Other Topics Concern   Not on file  Social History Narrative   Not on file   Social Determinants of Health   Financial Resource Strain: Low Risk    Difficulty of Paying Living Expenses: Not hard at all  Food Insecurity: No Food Insecurity   Worried About Charity fundraiser in the Last Year: Never true   [place] in the Last Year: Never true  Transportation Needs: No Data processing manager (Medical): No   Lack of Transportation (Non-Medical): No  Physical Activity: Inactive   Days of Exercise per Week: 0 days   Minutes of Exercise per Session: 0 min  Stress: No Stress Concern Present   Feeling of Stress : Not at all  Social Connections: Not on file    Tobacco Counseling Counseling given: Not Answered   Clinical Intake:  Pre-visit preparation completed: Yes  Pain : No/denies pain     Nutritional Status:  BMI 25 -29 Overweight Nutritional Risks: None Diabetes: No  How often do you need to have someone help you when you read instructions, pamphlets, or other written materials from your doctor or pharmacy?: 1 - Never What is the last grade level you completed in school?: some college  Diabetic? no  Interpreter Needed?: No  Information entered by :: NAllen LPN   Activities of Daily Living In your present state of health, do you have any difficulty performing the following activities: 06/27/2020  Hearing? N  Comment slight tinnitus  Vision? N  Difficulty concentrating or making decisions? N  Walking or climbing stairs? N  Dressing or bathing? N  Doing errands, shopping? N  Preparing Food and eating ? N  Using the Toilet? N  In the past six months, have you accidently leaked urine? N  Do you have problems with loss of bowel control? N  Managing your Medications? N  Managing your Finances? N  Housekeeping or managing your Housekeeping? N  Some recent data might be hidden    Patient Care Team: Rodriguez-[place], Sandrea Matte as PCP - General (Internal Medicine)  Indicate any recent Medical Services you may have received from other than [facility] providers in the past year (date may be approximate).     Assessment:   This is a routine wellness examination for Charlene Cox.  Hearing/Vision screen Vision Screening - Comments:: No regular eye exams, Dr. Delman Cheadle  Dietary issues and exercise activities discussed: Current Exercise Habits: The patient does not participate in regular exercise at present   Goals Addressed             This Visit's Progress    Patient Stated       06/27/2020, get down to 165, eat a healthier        Depression Screen PHQ 2/9 Scores 06/27/2020 01/19/2019 02/03/2018 08/25/2017 12/24/2016 10/16/2016 03/28/2016  PHQ - 2 Score 0 0 0 0 0 0 0  PHQ- 9 Score - 0 - - - - -    Fall Risk Fall Risk  06/27/2020 01/19/2019 02/03/2018 08/25/2017 10/16/2016  Falls in the past  year? 0 0 0 No No  Injury with Fall? - - 0 - -  Risk for fall due to : No Fall Risks Medication side effect - - -  Follow up Falls evaluation completed;Education provided;Falls prevention discussed Falls evaluation completed;Education provided;Falls prevention discussed - - -    FALL RISK PREVENTION PERTAINING TO THE HOME:  Any stairs in or around the home? Yes  If so, are there any without handrails? No  Home free of loose throw rugs in walkways, pet beds, electrical cords, etc? Yes  Adequate lighting in your home to reduce risk of falls? Yes   ASSISTIVE DEVICES UTILIZED TO PREVENT FALLS:  Life alert? No  Use of a cane, walker or w/c? No  Grab bars in the bathroom? No  Shower chair or bench in shower? No  Elevated toilet seat or a  handicapped toilet? Yes   TIMED UP AND GO:  Was the test performed? No .  .     Cognitive Function:     6CIT Screen 06/27/2020 01/19/2019  What Year? 0 points 0 points  What month? 0 points 0 points  What time? 0 points 0 points  Count back from 20 0 points 0 points  Months in reverse 0 points 0 points  Repeat phrase 0 points 0 points  Total Score 0 0    Immunizations Immunization History  Administered Date(s) Administered   PFIZER(Purple Top)SARS-COV-2 Vaccination 02/09/2019, 03/09/2019, 11/13/2019   Zoster Recombinat (Shingrix) 11/13/2019    TDAP status: Due, Education has been provided regarding the importance of this vaccine. Advised may receive this vaccine at local pharmacy or Health Dept. Aware to provide a copy of the vaccination record if obtained from local pharmacy or Health Dept. Verbalized acceptance and understanding.  Flu Vaccine status: Declined, Education has been provided regarding the importance of this vaccine but patient still declined. Advised may receive this vaccine at local pharmacy or Health Dept. Aware to provide a copy of the vaccination record if obtained from local pharmacy or Health Dept. Verbalized acceptance  and understanding.  Pneumococcal vaccine status: Declined,  Education has been provided regarding the importance of this vaccine but patient still declined. Advised may receive this vaccine at local pharmacy or Health Dept. Aware to provide a copy of the vaccination record if obtained from local pharmacy or Health Dept. Verbalized acceptance and understanding.   Covid-19 vaccine status: Completed vaccines  Qualifies for Shingles Vaccine? Yes   Zostavax completed Yes   Shingrix Completed?: needs second dose  Screening Tests Health Maintenance  Topic Date Due   Hepatitis C Screening  Never done   TETANUS/TDAP  Never done   DEXA SCAN  Never done   Zoster Vaccines- Shingrix (2 of 2) 01/08/2020   COVID-19 Vaccine (4 - Booster for Pfizer series) 03/12/2020   COLONOSCOPY (Pts 45-47yrs Insurance coverage will need to be confirmed)  06/27/2021 (Originally 02/12/1998)   PNA vac Low Risk Adult (1 of 2 - PCV13) 06/27/2021 (Originally 02/12/2018)   INFLUENZA VACCINE  08/12/2020   MAMMOGRAM  07/13/2021   HPV [place] Maintenance  Health Maintenance Due  Topic Date Due   Hepatitis C Screening  Never done   TETANUS/TDAP  Never done   DEXA SCAN  Never done   Zoster Vaccines- Shingrix (2 of 2) 01/08/2020   COVID-19 Vaccine (4 - Booster for [place] series) 03/12/2020    Colorectal cancer screening: decline   Mammogram status: Completed 07/14/2019. Repeat every year  Bone Density status: Ordered today. Pt provided with contact info and advised to call to schedule appt.  Lung Cancer Screening: (Low Dose CT Chest recommended if Age 77-80 years, 30 pack-year currently smoking OR have quit w/in 15years.) does not qualify.   Lung Cancer Screening Referral: no  Additional Screening:  Hepatitis C Screening: does qualify; due  Vision Screening: Recommended annual ophthalmology exams for early detection of glaucoma and other disorders of the eye. Is the patient up to date with  their annual eye exam?  No  Who is the provider or what is the name of the office in which the patient attends annual eye exams? Dr. Delman Cheadle If pt is not established with a provider, would they like to be referred to a provider to establish care? No .   Dental Screening: Recommended annual dental exams for proper oral  hygiene  Community Resource Referral / Chronic Care Management: CRR required this visit?  No   CCM required this visit?  No      Plan:     I have personally reviewed and noted the following in the patient's chart:   Medical and social history Use of alcohol, tobacco or illicit drugs  Current medications and supplements including opioid prescriptions.  Functional ability and status Nutritional status Physical activity Advanced directives List of other physicians Hospitalizations, surgeries, and ER visits in previous 12 months Vitals Screenings to include cognitive, depression, and falls Referrals and appointments  In addition, I have reviewed and discussed with patient certain preventive protocols, quality metrics, and best practice recommendations. A written personalized care plan for preventive services as well as general preventive health recommendations were provided to patient.     Kellie Simmering, LPN   [id number]/[id number]   Nurse Notes:

## 2020-07-03 DIAGNOSIS — L308 Other specified dermatitis: Secondary | ICD-10-CM | POA: Diagnosis not present

## 2020-07-03 DIAGNOSIS — L738 Other specified follicular disorders: Secondary | ICD-10-CM | POA: Diagnosis not present

## 2020-07-17 ENCOUNTER — Telehealth: Payer: Self-pay | Admitting: *Deleted

## 2020-07-24 ENCOUNTER — Ambulatory Visit: Payer: Medicare Other | Admitting: Nurse Practitioner

## 2020-09-06 ENCOUNTER — Telehealth: Payer: Self-pay | Admitting: *Deleted

## 2020-09-06 NOTE — Telephone Encounter (Signed)
error 

## 2020-09-09 NOTE — Telephone Encounter (Signed)
error 

## 2020-10-03 DIAGNOSIS — L738 Other specified follicular disorders: Secondary | ICD-10-CM | POA: Diagnosis not present

## 2020-10-03 DIAGNOSIS — L648 Other androgenic alopecia: Secondary | ICD-10-CM | POA: Diagnosis not present

## 2020-10-03 DIAGNOSIS — L308 Other specified dermatitis: Secondary | ICD-10-CM | POA: Diagnosis not present

## 2020-11-07 DIAGNOSIS — Z23 Encounter for immunization: Secondary | ICD-10-CM | POA: Diagnosis not present

## 2021-01-16 ENCOUNTER — Other Ambulatory Visit: Payer: Self-pay

## 2021-01-16 ENCOUNTER — Ambulatory Visit (INDEPENDENT_AMBULATORY_CARE_PROVIDER_SITE_OTHER): Payer: Medicare Other | Admitting: Adult Health

## 2021-01-16 ENCOUNTER — Ambulatory Visit (INDEPENDENT_AMBULATORY_CARE_PROVIDER_SITE_OTHER): Payer: Medicare Other

## 2021-01-16 ENCOUNTER — Encounter: Payer: Self-pay | Admitting: Adult Health

## 2021-01-16 VITALS — BP 132/80 | HR 71 | Ht 67.01 in | Wt 179.0 lb

## 2021-01-16 DIAGNOSIS — E559 Vitamin D deficiency, unspecified: Secondary | ICD-10-CM | POA: Diagnosis not present

## 2021-01-16 DIAGNOSIS — Z7689 Persons encountering health services in other specified circumstances: Secondary | ICD-10-CM | POA: Diagnosis not present

## 2021-01-16 DIAGNOSIS — Z8709 Personal history of other diseases of the respiratory system: Secondary | ICD-10-CM

## 2021-01-16 DIAGNOSIS — J0100 Acute maxillary sinusitis, unspecified: Secondary | ICD-10-CM

## 2021-01-16 DIAGNOSIS — R0989 Other specified symptoms and signs involving the circulatory and respiratory systems: Secondary | ICD-10-CM | POA: Diagnosis not present

## 2021-01-16 DIAGNOSIS — I10 Essential (primary) hypertension: Secondary | ICD-10-CM | POA: Diagnosis not present

## 2021-01-16 DIAGNOSIS — Z131 Encounter for screening for diabetes mellitus: Secondary | ICD-10-CM

## 2021-01-16 DIAGNOSIS — Z1389 Encounter for screening for other disorder: Secondary | ICD-10-CM

## 2021-01-16 DIAGNOSIS — E538 Deficiency of other specified B group vitamins: Secondary | ICD-10-CM

## 2021-01-16 DIAGNOSIS — J40 Bronchitis, not specified as acute or chronic: Secondary | ICD-10-CM | POA: Diagnosis not present

## 2021-01-16 DIAGNOSIS — F419 Anxiety disorder, unspecified: Secondary | ICD-10-CM

## 2021-01-16 MED ORDER — GUAIFENESIN-CODEINE 100-10 MG/5ML PO SYRP: 5.0000 mL | ORAL_SOLUTION | Freq: Three times a day (TID) | ORAL | 0 refills | Status: DC | PRN

## 2021-01-16 MED ORDER — ALPRAZOLAM 0.5 MG PO TABS: 0.5000 mg | ORAL_TABLET | Freq: Once | ORAL | 0 refills | Status: DC | PRN

## 2021-01-16 MED ORDER — ALBUTEROL SULFATE HFA 108 (90 BASE) MCG/ACT IN AERS: INHALATION_SPRAY | RESPIRATORY_TRACT | 1 refills | Status: DC

## 2021-01-16 NOTE — Progress Notes (Signed)
New Patient Office Visit  Subjective:  Patient ID: Charlene Cox, female    DOB: 1956-01-11  Age: 66 y.o. MRN: [id number]  CC:  Chief Complaint  Patient presents with   New Patient (Initial Visit)    HPI Charlene Cox presents for new patient visit.  She was treated for virtual visit, for bronchitis. She was treated with the prednisone. Augmentin was given also . She did video visit at [place] 10 days go. 10 days ago tested negative for covid and not for flu. Still has cough lingering and keeping from sleeping.   Going on flight has old bottle expired in 2020 she brings with her,she takes for flights only due to anxiety flights.  Hypertension on lisinopril HCTZ 10/12.5 mg daily,doing well with it.  Has been on ozempic with patient assistance for weight loss spoke with Jolinda Croak D at office this is no longer covered.  Patient  denies any fever, body aches,chills, rash, chest pain, shortness of breath, nausea, vomiting, or diarrhea.  Denies dizziness, lightheadedness, pre syncopal or syncopal episodes.    Past Medical History:  Diagnosis Date   Allergy    Anxiety    Arthritis    Chronic low back pain    HTN (hypertension) 03/28/2016   Osteoarthritis of left hip    severe determined by x-ray    Past Surgical History:  Procedure Laterality Date   COSMETIC SURGERY  07/2018   tummy tuck and facelift    No family history on file.  Social History   Socioeconomic History   Marital status: Married    Spouse name: Not on file   Number of children: Not on file   Years of education: Not on file   Highest education level: Not on file  Occupational History   Occupation: semi retired  Tobacco Use   Smoking status: Never   Smokeless tobacco: Never  Vaping Use   Vaping Use: Never used  Substance and Sexual Activity   Alcohol use: No    Alcohol/week: 0.0 standard drinks   Drug use: No   Sexual activity: Not Currently  Other Topics Concern   Not on file  Social  History Narrative   Not on file   Social Determinants of Health   Financial Resource Strain: Low Risk    Difficulty of Paying Living Expenses: Not hard at all  Food Insecurity: No Food Insecurity   Worried About Charity fundraiser in the Last Year: Never true   [place] in the Last Year: Never true  Transportation Needs: No Transportation Needs   Lack of Transportation (Medical): No   Lack of Transportation (Non-Medical): No  Physical Activity: Inactive   Days of Exercise per Week: 0 days   Minutes of Exercise per Session: 0 min  Stress: No Stress Concern Present   Feeling of Stress : Not at all  Social Connections: Not on file  Intimate Partner Violence: Not on file    ROS Review of Systems  Constitutional: Negative.   HENT:  Positive for congestion and postnasal drip. Negative for dental problem, drooling, ear discharge, ear pain and rhinorrhea.   Respiratory:  Positive for cough. Negative for apnea, choking, chest tightness, shortness of breath, wheezing and stridor.   Cardiovascular: Negative.   Gastrointestinal: Negative.   Genitourinary: Negative.   Musculoskeletal: Negative.   Neurological: Negative.   Psychiatric/Behavioral: Negative.     Objective:   Today's Vitals: BP 132/80    Pulse 71    Ht  5' 7.01" (1.702 m)    Wt 179 lb (81.2 kg)    SpO2 99%    BMI 28.03 kg/m   Physical Exam Vitals reviewed.  Constitutional:      Appearance: Normal appearance. She is well-developed. She is not ill-appearing or diaphoretic.  HENT:     Head: Normocephalic and atraumatic.     Right Ear: Tympanic membrane, ear canal and external ear normal.     Left Ear: Tympanic membrane, ear canal and external ear normal.     Nose: Nose normal. No congestion or rhinorrhea.     Mouth/Throat:     Mouth: Mucous membranes are moist.     Pharynx: Oropharynx is clear.  Eyes:     Conjunctiva/sclera: Conjunctivae normal.  Neck:     Thyroid: No thyroid mass or thyromegaly.   Cardiovascular:     Rate and Rhythm: Normal rate and regular rhythm.     Pulses: Normal pulses.     Heart sounds: Normal heart sounds.  Pulmonary:     Effort: Pulmonary effort is normal.     Breath sounds: Normal breath sounds. No wheezing, rhonchi or rales.  Lymphadenopathy:     Head:     Right side of head: No submental, submandibular, tonsillar, preauricular, posterior auricular or occipital adenopathy.     Left side of head: No submental, submandibular, tonsillar, preauricular, posterior auricular or occipital adenopathy.     Cervical: No cervical adenopathy.  Skin:    General: Skin is warm and dry.  Neurological:     Mental Status: She is alert.  Psychiatric:        Speech: Speech normal.        Behavior: Behavior normal.        Thought Content: Thought content normal.    Assessment & Plan:   Problem List Items Addressed This Visit       Cardiovascular and Mediastinum   HTN (hypertension)    Currently controlled on lisinopril- HCTZ  10/12.5 mg once daily.  Patient  denies any fever, body aches,chills, rash, chest pain, shortness of breath, nausea, vomiting, or diarrhea.          Respiratory   Chest congestion   Relevant Medications   guaiFENesin-codeine (ROBITUSSIN AC) 100-10 MG/5ML syrup   albuterol (VENTOLIN HFA) 108 (90 Base) MCG/ACT inhaler   Other Relevant Orders   DG Chest 2 View (Completed)   Acute non-recurrent maxillary sinusitis   Relevant Medications   guaiFENesin-codeine (ROBITUSSIN AC) 100-10 MG/5ML syrup   albuterol (VENTOLIN HFA) 108 (90 Base) MCG/ACT inhaler     Other   History of bronchitis - Primary   Relevant Medications   guaiFENesin-codeine (ROBITUSSIN AC) 100-10 MG/5ML syrup   albuterol (VENTOLIN HFA) 108 (90 Base) MCG/ACT inhaler   Other Relevant Orders   DG Chest 2 View (Completed)   Vitamin D deficiency   Relevant Orders   VITAMIN D 25 Hydroxy (Vit-D Deficiency, Fractures) (Completed)   Intrinsic Factor Antibodies   B12 and  Folate Panel (Completed)   Vitamin B12 deficiency   Screening for diabetes mellitus (DM)   Relevant Orders   CBC with Differential/Platelet (Completed)   Comprehensive metabolic panel (Completed)   Lipid panel (Completed)   TSH (Completed)   Hemoglobin A1c (Completed)   RESOLVED: Anxiety    Xanax for flight anxiety only PRN.       Relevant Medications   ALPRAZolam (XANAX) 0.5 MG tablet    Outpatient Encounter Medications as of 01/16/2021  Medication Sig   ALPRAZolam (  XANAX) 0.5 MG tablet Take 1 tablet (0.5 mg total) by mouth once as needed for up to 1 dose for anxiety. For flight anxiety.   guaiFENesin-codeine (ROBITUSSIN AC) 100-10 MG/5ML syrup Take 5 mLs by mouth 3 (three) times daily as needed for cough.   Olopatadine HCl 0.2 % SOLN Apply 1 drop to eye daily.   [DISCONTINUED] HYDROcodone-homatropine (HYDROMET) 5-1.5 MG/5ML syrup Take 5 mLs by mouth every 6 (six) hours as needed.   [DISCONTINUED] VENTOLIN HFA 108 (90 Base) MCG/ACT inhaler TAKE 2 PUFFS BY MOUTH EVERY 6 HOURS AS NEEDED FOR WHEEZE OR SHORTNESS OF BREATH   albuterol (VENTOLIN HFA) 108 (90 Base) MCG/ACT inhaler TAKE 2 PUFFS BY MOUTH EVERY 6 HOURS AS NEEDED FOR WHEEZE OR SHORTNESS OF BREATH   lisinopril-hydrochlorothiazide (ZESTORETIC) 10-12.5 MG tablet Take 1 tablet by mouth daily. PATIENT NEEDS AN APPOINTMENT (Patient not taking: Reported on 06/27/2020)   triamcinolone (NASACORT) 55 MCG/ACT AERO nasal inhaler Place 1 spray into the nose 2 (two) times daily.   No facility-administered encounter medications on file as of 01/16/2021.  Schedule fasting labs and CPE in near future advised.   Follow-up: Return in about 3 months (around 04/16/2021), or if symptoms worsen or fail to improve, for Go to Emergency room/ urgent care if worse, at any time for any worsening symptoms.   Marcille Buffy, FNP

## 2021-01-16 NOTE — Patient Instructions (Addendum)
Cough, Adult °A cough helps to clear your throat and lungs. A cough may be a sign of an illness or another medical condition. °An acute cough may only last 2-3 weeks, while a chronic cough may last 8 or more weeks. °Many things can cause a cough. They include: °Germs (viruses or bacteria) that attack the airway. °Breathing in things that bother (irritate) your lungs. °Allergies. °Asthma. °Mucus that runs down the back of your throat (postnasal drip). °Smoking. °Acid backing up from the stomach into the tube that moves food from the mouth to the stomach (gastroesophageal reflux). °Some medicines. °Lung problems. °Other medical conditions, such as heart failure or a blood clot in the lung (pulmonary embolism). °Follow these instructions at home: °Medicines °Take over-the-counter and prescription medicines only as told by your doctor. °Talk with your doctor before you take medicines that stop a cough (cough suppressants). °Lifestyle ° °Do not smoke, and try not to be around smoke. Do not use any products that contain nicotine or tobacco, such as cigarettes, e-cigarettes, and chewing tobacco. If you need help quitting, ask your doctor. °Drink enough fluid to keep your pee (urine) pale yellow. °Avoid caffeine. °Do not drink alcohol if your doctor tells you not to drink. °General instructions ° °Watch for any changes in your cough. Tell your doctor about them. °Always cover your mouth when you cough. °Stay away from things that make you cough, such as perfume, candles, campfire smoke, or cleaning products. °If the air is dry, use a cool mist vaporizer or humidifier in your home. °If your cough is worse at night, try using extra pillows to raise your head up higher while you sleep. °Rest as needed. °Keep all follow-up visits as told by your doctor. This is important. °Contact a doctor if: °You have new symptoms. °You cough up pus. °Your cough does not get better after 2-3 weeks, or your cough gets worse. °Cough medicine  does not help your cough and you are not sleeping well. °You have pain that gets worse or pain that is not helped with medicine. °You have a fever. °You are losing weight and you do not know why. °You have night sweats. °Get help right away if: °You cough up blood. °You have trouble breathing. °Your heartbeat is very fast. °These symptoms may be an emergency. Do not wait to see if the symptoms will go away. Get medical help right away. Call your local emergency services (911 in the [place] Do not drive yourself to the hospital. °Summary °A cough helps to clear your throat and lungs. Many things can cause a cough. °Take over-the-counter and prescription medicines only as told by your doctor. °Always cover your mouth when you cough. °Contact a doctor if you have new symptoms or you have a cough that does not get better or gets worse. °This information is not intended to replace advice given to you by your health care provider. Make sure you discuss any questions you have with your health care provider. °Document Revised: 02/17/2019 Document Reviewed: 01/17/2018 °Elsevier Patient Education © 2022 [employer] Inc[employer] ° ° °Health Maintenance, Female °Adopting a healthy lifestyle and getting preventive care are important in promoting health and wellness. Ask your health care provider about: °The right schedule for you to have regular tests and exams. °Things you can do on your own to prevent diseases and keep yourself healthy. °What should I know about diet, weight, and exercise? °Eat a healthy diet ° °Eat a diet that includes plenty of vegetables, fruits,   low-fat dairy products, and lean protein. °Do not eat a lot of foods that are high in solid fats, added sugars, or sodium. °Maintain a healthy weight °Body mass index (BMI) is used to identify weight problems. It estimates body fat based on height and weight. Your health care provider can help determine your BMI and help you achieve or maintain a healthy weight. °Get regular  exercise °Get regular exercise. This is one of the most important things you can do for your health. Most adults should: °Exercise for at least 150 minutes each week. The exercise should increase your heart rate and make you sweat (moderate-intensity exercise). °Do strengthening exercises at least twice a week. This is in addition to the moderate-intensity exercise. °Spend less time sitting. Even light physical activity can be beneficial. °Watch cholesterol and blood lipids °Have your blood tested for lipids and cholesterol at 68 years of age, then have this test every 5 years. °Have your cholesterol levels checked more often if: °Your lipid or cholesterol levels are high. °You are older than 68 years of age. °You are at high risk for heart disease. °What should I know about cancer screening? °Depending on your health history and family history, you may need to have cancer screening at various ages. This may include screening for: °Breast cancer. °Cervical cancer. °Colorectal cancer. °Skin cancer. °Lung cancer. °What should I know about heart disease, diabetes, and high blood pressure? °Blood pressure and heart disease °High blood pressure causes heart disease and increases the risk of stroke. This is more likely to develop in people who have high blood pressure readings or are overweight. °Have your blood pressure checked: °Every 3-5 years if you are 18-39 years of age. °Every year if you are 40 years old or older. °Diabetes °Have regular diabetes screenings. This checks your fasting blood sugar level. Have the screening done: °Once every three years after age 40 if you are at a normal weight and have a low risk for diabetes. °More often and at a younger age if you are overweight or have a high risk for diabetes. °What should I know about preventing infection? °Hepatitis B °If you have a higher risk for hepatitis B, you should be screened for this virus. Talk with your health care provider to find out if you are at  risk for hepatitis B infection. °Hepatitis C °Testing is recommended for: °Everyone born from 1945 through 1965. °Anyone with known risk factors for hepatitis C. °Sexually transmitted infections (STIs) °Get screened for STIs, including gonorrhea and chlamydia, if: °You are sexually active and are younger than 68 years of age. °You are older than 68 years of age and your health care provider tells you that you are at risk for this type of infection. °Your sexual activity has changed since you were last screened, and you are at increased risk for chlamydia or gonorrhea. Ask your health care provider if you are at risk. °Ask your health care provider about whether you are at high risk for HIV. Your health care provider may recommend a prescription medicine to help prevent HIV infection. If you choose to take medicine to prevent HIV, you should first get tested for HIV. You should then be tested every 3 months for as long as you are taking the medicine. °Pregnancy °If you are about to stop having your period (premenopausal) and you may become pregnant, seek counseling before you get pregnant. °Take 400 to 800 micrograms (mcg) of folic acid every day if you become pregnant. °  Ask for birth control (contraception) if you want to prevent pregnancy. °Osteoporosis and menopause °Osteoporosis is a disease in which the bones lose minerals and strength with aging. This can result in bone fractures. If you are 65 years old or older, or if you are at risk for osteoporosis and fractures, ask your health care provider if you should: °Be screened for bone loss. °Take a calcium or vitamin D supplement to lower your risk of fractures. °Be given hormone replacement therapy (HRT) to treat symptoms of menopause. °Follow these instructions at home: °Alcohol use °Do not drink alcohol if: °Your health care provider tells you not to drink. °You are pregnant, may be pregnant, or are planning to become pregnant. °If you drink alcohol: °Limit  how much you have to: °0-1 drink a day. °Know how much alcohol is in your drink. In the [place] one drink equals one 12 oz bottle of beer (355 mL), one 5 oz glass of wine (148 mL), or one 1½ oz glass of hard liquor (44 mL). °Lifestyle °Do not use any products that contain nicotine or tobacco. These products include cigarettes, chewing tobacco, and vaping devices, such as e-cigarettes. If you need help quitting, ask your health care provider. °Do not use street drugs. °Do not share needles. °Ask your health care provider for help if you need support or information about quitting drugs. °General instructions °Schedule regular health, dental, and eye exams. °Stay current with your vaccines. °Tell your health care provider if: °You often feel depressed. °You have ever been abused or do not feel safe at home. °Summary °Adopting a healthy lifestyle and getting preventive care are important in promoting health and wellness. °Follow your health care provider's instructions about healthy diet, exercising, and getting tested or screened for diseases. °Follow your health care provider's instructions on monitoring your cholesterol and blood pressure. °This information is not intended to replace advice given to you by your health care provider. Make sure you discuss any questions you have with your health care provider. °Document Revised: 05/20/2020 Document Reviewed: 05/20/2020 °Elsevier Patient Education © 2022 [employer] Inc. ° °

## 2021-01-16 NOTE — Progress Notes (Signed)
Chest x ray is within normal limits.

## 2021-01-17 ENCOUNTER — Encounter: Payer: Self-pay | Admitting: Adult Health

## 2021-01-17 LAB — COMPREHENSIVE METABOLIC PANEL
ALT: 13 U/L (ref 0–35)
AST: 14 U/L (ref 0–37)
Albumin: 4.7 g/dL (ref 3.5–5.2)
Alkaline Phosphatase: 56 U/L (ref 39–117)
BUN: 11 mg/dL (ref 6–23)
CO2: 26 mEq/L (ref 19–32)
Calcium: 9 mg/dL (ref 8.4–10.5)
Chloride: 103 mEq/L (ref 96–112)
Creatinine, Ser: 0.8 mg/dL (ref 0.40–1.20)
GFR: 75.97 mL/min (ref >60.00)
Glucose, Bld: 86 mg/dL (ref 70–99)
Potassium: 3.9 mEq/L (ref 3.5–5.1)
Sodium: 138 mEq/L (ref 135–145)
Total Bilirubin: 0.5 mg/dL (ref 0.2–1.2)
Total Protein: 6.8 g/dL (ref 6.0–8.3)

## 2021-01-17 LAB — HEMOGLOBIN A1C: Hgb A1c MFr Bld: 5.7 % (ref 4.6–6.5)

## 2021-01-17 LAB — URINALYSIS, MICROSCOPIC ONLY: RBC / HPF: NONE SEEN (ref 0–?)

## 2021-01-17 LAB — B12 AND FOLATE PANEL
Folate: 12.5 ng/mL (ref >5.9)
Vitamin B-12: 250 pg/mL (ref 211–911)

## 2021-01-17 LAB — URINE CULTURE
MICRO NUMBER:: 12831988
SPECIMEN QUALITY:: ADEQUATE

## 2021-01-17 LAB — CBC WITH DIFFERENTIAL/PLATELET
Basophils Absolute: 0.1 10*3/uL (ref 0.0–0.1)
Basophils Relative: 1.2 % (ref 0.0–3.0)
Eosinophils Absolute: 0.1 10*3/uL (ref 0.0–0.7)
Eosinophils Relative: 0.6 % (ref 0.0–5.0)
HCT: 39.9 % (ref 36.0–46.0)
Hemoglobin: 13.6 g/dL (ref 12.0–15.0)
Lymphocytes Relative: 42.2 % (ref 12.0–46.0)
Lymphs Abs: 4 10*3/uL (ref 0.7–4.0)
MCHC: 34 g/dL (ref 30.0–36.0)
MCV: 95.2 fl (ref 78.0–100.0)
Monocytes Absolute: 0.6 10*3/uL (ref 0.1–1.0)
Monocytes Relative: 5.9 % (ref 3.0–12.0)
Neutro Abs: 4.8 10*3/uL (ref 1.4–7.7)
Neutrophils Relative %: 50.1 % (ref 43.0–77.0)
Platelets: 232 10*3/uL (ref 150.0–400.0)
RBC: 4.19 Mil/uL (ref 3.87–5.11)
RDW: 13.9 % (ref 11.5–15.5)
WBC: 9.5 10*3/uL (ref 4.0–10.5)

## 2021-01-17 LAB — TSH: TSH: 1.9 u[IU]/mL (ref 0.35–5.50)

## 2021-01-17 LAB — VITAMIN D 25 HYDROXY (VIT D DEFICIENCY, FRACTURES): VITD: 23.58 ng/mL — ABNORMAL LOW (ref 30.00–100.00)

## 2021-01-17 LAB — LIPID PANEL
Cholesterol: 197 mg/dL (ref 0–200)
HDL: 46.9 mg/dL (ref >39.00)
LDL Cholesterol: 114 mg/dL — ABNORMAL HIGH (ref 0–99)
NonHDL: 149.81
Total CHOL/HDL Ratio: 4
Triglycerides: 178 mg/dL — ABNORMAL HIGH (ref 0.0–149.0)
VLDL: 35.6 mg/dL (ref 0.0–40.0)

## 2021-01-19 ENCOUNTER — Encounter: Payer: Self-pay | Admitting: Adult Health

## 2021-01-19 DIAGNOSIS — E538 Deficiency of other specified B group vitamins: Secondary | ICD-10-CM | POA: Insufficient documentation

## 2021-01-19 DIAGNOSIS — E559 Vitamin D deficiency, unspecified: Secondary | ICD-10-CM | POA: Insufficient documentation

## 2021-01-19 DIAGNOSIS — Z8709 Personal history of other diseases of the respiratory system: Secondary | ICD-10-CM | POA: Insufficient documentation

## 2021-01-19 DIAGNOSIS — J0100 Acute maxillary sinusitis, unspecified: Secondary | ICD-10-CM | POA: Insufficient documentation

## 2021-01-19 DIAGNOSIS — R0989 Other specified symptoms and signs involving the circulatory and respiratory systems: Secondary | ICD-10-CM | POA: Insufficient documentation

## 2021-01-19 DIAGNOSIS — Z7689 Persons encountering health services in other specified circumstances: Secondary | ICD-10-CM | POA: Insufficient documentation

## 2021-01-19 DIAGNOSIS — Z1211 Encounter for screening for malignant neoplasm of colon: Secondary | ICD-10-CM | POA: Insufficient documentation

## 2021-01-19 LAB — INTRINSIC FACTOR ANTIBODIES: Intrinsic Factor: NEGATIVE

## 2021-01-19 NOTE — Assessment & Plan Note (Signed)
Xanax for flight anxiety only PRN.

## 2021-01-19 NOTE — Assessment & Plan Note (Signed)
Currently controlled on lisinopril- HCTZ  10/12.5 mg once daily.  Patient  denies any fever, body aches,chills, rash, chest pain, shortness of breath, nausea, vomiting, or diarrhea.

## 2021-01-19 NOTE — Progress Notes (Signed)
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  CMP, CBC, and TSH, Hemoglobin A1C  within normal limits.  Triglycerides and LDL elevated.  Discuss lifestyle modification with patient e.g. increase exercise, fiber, fruits, vegetables, lean meat, and omega 3/fish intake and decrease saturated fat.  If patient following strict diet and exercise program already please schedule follow up appointment with primary care physician  B12 is on low end normal, and can add B12 sublingual 1,000 mcg once daily.  Vitamin D is low, add Vitamin d3 at 2,000 international units once daily.   Recheck B12 and vitamin D lab in 3 months.

## 2021-01-20 NOTE — Progress Notes (Signed)
Intrinsic factor negative you are able to absorb B12 supplements orally.

## 2021-01-21 ENCOUNTER — Encounter: Payer: Self-pay | Admitting: Adult Health

## 2021-01-21 DIAGNOSIS — Z6828 Body mass index (BMI) 28.0-28.9, adult: Secondary | ICD-10-CM

## 2021-01-22 ENCOUNTER — Telehealth: Payer: Self-pay | Admitting: Nurse Practitioner

## 2021-01-22 DIAGNOSIS — Z6828 Body mass index (BMI) 28.0-28.9, adult: Secondary | ICD-10-CM | POA: Insufficient documentation

## 2021-01-22 MED ORDER — OZEMPIC (0.25 OR 0.5 MG/DOSE) 2 MG/1.5ML ~~LOC~~ SOPN: 0.5000 mg | PEN_INJECTOR | SUBCUTANEOUS | 1 refills | Status: DC

## 2021-01-22 NOTE — Telephone Encounter (Signed)
Called pt to verify [facility] being her provider. Pt said she has new provider. No longer [facility] pt.

## 2021-01-23 ENCOUNTER — Encounter: Payer: Self-pay | Admitting: Adult Health

## 2021-02-05 ENCOUNTER — Encounter: Payer: Self-pay | Admitting: Adult Health

## 2021-02-12 ENCOUNTER — Ambulatory Visit (INDEPENDENT_AMBULATORY_CARE_PROVIDER_SITE_OTHER): Payer: Medicare Other | Admitting: Adult Health

## 2021-02-12 ENCOUNTER — Telehealth: Payer: Self-pay | Admitting: Adult Health

## 2021-02-12 ENCOUNTER — Encounter: Payer: Self-pay | Admitting: Adult Health

## 2021-02-12 ENCOUNTER — Other Ambulatory Visit: Payer: Self-pay

## 2021-02-12 VITALS — BP 122/82 | HR 76 | Temp 98.4°F | Resp 14 | Ht 67.01 in | Wt 174.4 lb

## 2021-02-12 DIAGNOSIS — I10 Essential (primary) hypertension: Secondary | ICD-10-CM

## 2021-02-12 DIAGNOSIS — Z6828 Body mass index (BMI) 28.0-28.9, adult: Secondary | ICD-10-CM | POA: Diagnosis not present

## 2021-02-12 DIAGNOSIS — R7303 Prediabetes: Secondary | ICD-10-CM | POA: Diagnosis not present

## 2021-02-12 DIAGNOSIS — H6993 Unspecified Eustachian tube disorder, bilateral: Secondary | ICD-10-CM | POA: Insufficient documentation

## 2021-02-12 DIAGNOSIS — H6983 Other specified disorders of Eustachian tube, bilateral: Secondary | ICD-10-CM | POA: Diagnosis not present

## 2021-02-12 DIAGNOSIS — R0981 Nasal congestion: Secondary | ICD-10-CM

## 2021-02-12 DIAGNOSIS — Z8709 Personal history of other diseases of the respiratory system: Secondary | ICD-10-CM | POA: Diagnosis not present

## 2021-02-12 DIAGNOSIS — Z79890 Hormone replacement therapy (postmenopausal): Secondary | ICD-10-CM

## 2021-02-12 DIAGNOSIS — F419 Anxiety disorder, unspecified: Secondary | ICD-10-CM

## 2021-02-12 DIAGNOSIS — R42 Dizziness and giddiness: Secondary | ICD-10-CM

## 2021-02-12 MED ORDER — MECLIZINE HCL 12.5 MG PO TABS: 12.5000 mg | ORAL_TABLET | Freq: Three times a day (TID) | ORAL | 0 refills | Status: DC | PRN

## 2021-02-12 MED ORDER — PREDNISONE 10 MG (21) PO TBPK: ORAL_TABLET | ORAL | 0 refills | Status: DC

## 2021-02-12 MED ORDER — FLUTICASONE PROPIONATE 50 MCG/ACT NA SUSP: 2.0000 | Freq: Every day | NASAL | 6 refills | Status: DC

## 2021-02-12 NOTE — Telephone Encounter (Signed)
Pt called in stating she is having tinnitus and is having symptoms fatigue, dizziness and does not want to do anything. Pt has been having symptoms off and on. sent to access nurse

## 2021-02-12 NOTE — Patient Instructions (Addendum)
Vertigo Vertigo is the feeling that you or your surroundings are moving when they are not. This feeling can come and go at any time. Vertigo often goes away on its own. Vertigo can be dangerous if it occurs while you are doing something that could endanger yourself or others, such as driving or operating machinery. Your health care provider will do tests to try to determine the cause of your vertigo. Tests will also help your health care provider decide how best to treat your condition. Follow these instructions at home: Eating and drinking   Dehydration can make vertigo worse. Drink enough fluid to keep your urine pale yellow. Do not drink alcohol. Activity Return to your normal activities as told by your health care provider. Ask your health care provider what activities are safe for you. In the morning, first sit up on the side of the bed. When you feel okay, stand slowly while you hold onto something until you know that your balance is fine. Move slowly. Avoid sudden body or head movements or certain positions, as told by your health care provider. If you have trouble walking or keeping your balance, try using a cane for stability. If you feel dizzy or unstable, sit down right away. Avoid doing any tasks that would cause danger to you or others if vertigo occurs. Avoid bending down if you feel dizzy. Place items in your home so that they are easy for you to reach without bending or leaning over. Do not drive or use machinery if you feel dizzy. General instructions Take over-the-counter and prescription medicines only as told by your health care provider. Keep all follow-up visits. This is important. Contact a health care provider if: Your medicines do not relieve your vertigo or they make it worse. Your condition gets worse or you develop new symptoms. You have a fever. You develop nausea or vomiting, or if nausea gets worse. Your family or friends notice any behavioral changes. You have  numbness or a prickling and tingling sensation in part of your body. Get help right away if you: Are always dizzy or you faint. Develop severe headaches. Develop a stiff neck. Develop sensitivity to light. Have difficulty moving or speaking. Have weakness in your hands, arms, or legs. Have changes in your hearing or vision. These symptoms may represent a serious problem that is an emergency. Do not wait to see if the symptoms will go away. Get medical help right away. Call your local emergency services (911 in the [place] Do not drive yourself to the hospital. Summary Vertigo is the feeling that you or your surroundings are moving when they are not. Your health care provider will do tests to try to determine the cause of your vertigo. Follow instructions for home care. You may be told to avoid certain tasks, positions, or movements. Contact a health care provider if your medicines do not relieve your symptoms, or if you have a fever, nausea, vomiting, or changes in behavior. Get help right away if you have severe headaches or difficulty speaking, or you develop hearing or vision problems. This information is not intended to replace advice given to you by your health care provider. Make sure you discuss any questions you have with your health care provider. Document Revised: 11/29/2019 Document Reviewed: 11/29/2019 Elsevier Patient Education  2022 [place]. Tinnitus Tinnitus refers to hearing a sound when there is no actual source for that sound. This is often described as ringing in the ears. However, people with this condition  may hear a variety of noises, in one ear or in both ears. The sounds of tinnitus can be soft, loud, or somewhere in between. Tinnitus can last for a few seconds or can be constant for days. It may go away without treatment and come back at various times. When tinnitus is constant or happens often, it can lead to other problems, such as trouble sleeping and trouble  concentrating. Almost everyone experiences tinnitus at some point. Tinnitus is not the same as hearing loss. Tinnitus that is long-lasting (chronic) or comes back often (recurs) may require medical attention. What are the causes? The cause of tinnitus is often not known. In some cases, it can result from: Exposure to loud noises from machinery, music, or other sources. An object (foreign body) stuck in the ear. Earwax buildup. Drinking alcohol or caffeine. Taking certain medicines. Age-related hearing loss. It may also be caused by medical conditions such as: Ear or sinus infections. Heart diseases or high blood pressure. Allergies. Mnire's disease. Thyroid problems. Tumors. A weak, bulging blood vessel (aneurysm) near the ear. What increases the risk? The following factors may make you more likely to develop this condition: Exposure to loud noises. Age. Tinnitus is more likely in older individuals. Using alcohol or tobacco. What are the signs or symptoms? The main symptom of tinnitus is hearing a sound when there is no source for that sound. It may sound like: Buzzing. Sizzling. Ringing. Blowing air. Hissing. Whistling. Other sounds may include: Roaring. Running water. A musical note. Tapping. Humming. Symptoms may affect only one ear (unilateral) or both ears (bilateral). How is this diagnosed? Tinnitus is diagnosed based on your symptoms, your medical history, and a physical exam. Your health care provider may do a thorough hearing test (audiologic exam) if your tinnitus: Is unilateral. Causes hearing difficulties. Lasts 6 months or longer. You may work with a health care provider who specializes in hearing disorders (audiologist). You may be asked questions about your symptoms and how they affect your daily life. You may have other tests done, such as: CT scan. MRI. An imaging test of how blood flows through your blood vessels (angiogram). How is this  treated? Treating an underlying medical condition can sometimes make tinnitus go away. If your tinnitus continues, other treatments may include: Therapy and counseling to help you manage the stress of living with tinnitus. Sound generators to mask the tinnitus. These include: Tabletop sound machines that play relaxing sounds to help you fall asleep. Wearable devices that fit in your ear and play sounds or music. Acoustic neural stimulation. This involves using headphones to listen to music that contains an auditory signal. Over time, listening to this signal may change some pathways in your brain and make you less sensitive to tinnitus. This treatment is used for very severe cases when no other treatment is working. Using hearing aids or cochlear implants if your tinnitus is related to hearing loss. Hearing aids are worn in the outer ear. Cochlear implants are surgically placed in the inner ear. Follow these instructions at home: Managing symptoms   When possible, avoid being in loud places and being exposed to loud sounds. Wear hearing protection, such as earplugs, when you are exposed to loud noises. Use a white noise machine, a humidifier, or other devices to mask the sound of tinnitus. Practice techniques for reducing stress, such as meditation, yoga, or deep breathing. Work with your health care provider if you need help with managing stress. Sleep with your head slightly raised.  This may reduce the impact of tinnitus. General instructions Do not use stimulants, such as nicotine, alcohol, or caffeine. Talk with your health care provider about other stimulants to avoid. Stimulants are substances that can make you feel alert and attentive by increasing certain activities in the body (such as heart rate and blood pressure). These substances may make tinnitus worse. Take over-the-counter and prescription medicines only as told by your health care provider. Try to get plenty of sleep each  night. Keep all follow-up visits. This is important. Contact a health care provider if: Your tinnitus continues for 3 weeks or longer without stopping. You develop sudden hearing loss. Your symptoms get worse or do not get better with home care. You feel you are not able to manage the stress of living with tinnitus. Get help right away if: You develop tinnitus after a head injury. You have tinnitus along with any of the following: Dizziness. Nausea and vomiting. Loss of balance. Sudden, severe headache. Vision changes. Facial weakness or weakness of arms or legs. These symptoms may represent a serious problem that is an emergency. Do not wait to see if the symptoms will go away. Get medical help right away. Call your local emergency services (911 in the [place] Do not drive yourself to the hospital. Summary Tinnitus refers to hearing a sound when there is no actual source for that sound. This is often described as ringing in the ears. Symptoms may affect only one ear (unilateral) or both ears (bilateral). Use a white noise machine, a humidifier, or other devices to mask the sound of tinnitus. Do not use stimulants, such as nicotine, alcohol, or caffeine. These substances may make tinnitus worse. This information is not intended to replace advice given to you by your health care provider. Make sure you discuss any questions you have with your health care provider. Document Revised: 12/04/2019 Document Reviewed: 12/04/2019 Elsevier Patient Education  2022 [place]. Dizziness Dizziness is a common problem. It makes you feel unsteady or light-headed. You may feel like you are about to pass out (faint). Dizziness can lead to getting hurt if you stumble or fall. Dizziness can be caused by many things, including: Medicines. Not having enough water in your body (dehydration). Illness. Follow these instructions at home: Eating and drinking  Drink enough fluid to keep your pee (urine) pale  yellow. This helps to keep you from getting dehydrated. Try to drink more clear fluids, such as water. Do not drink alcohol. Limit how much caffeine you drink or eat, if your doctor tells you to do that. Limit how much salt (sodium) you drink or eat, if your doctor tells you to do that. Activity pibName

## 2021-02-12 NOTE — Progress Notes (Signed)
Acute Office Visit  Subjective:    Patient ID: Charlene Cox, female    DOB: Oct 26, 1955, 66 y.o.   MRN: [id number]  Chief Complaint  Patient presents with   Acute Visit    Discuss lightheadedness,tinnitus at times with chronic history. Feeling depressed hard to get motivated.     HPI Patient is in today for the past week to 10 days she has felt bad, she does have a mild nasal sniffle.  Tinnitus off and on for years.  Denies any headache, does  have tinnitus.  No motivation.  Mild dizziness lightheadedness. Trying to hydrate  recently increased water.    Mild abdominal cramping. No bleeding. This is occasional. Denies any tarry stools.    Dr. Delman Cheadle no recent eye exam. Recommend yearly opthalmology.    Patient  denies any fever, body aches,chills, rash, chest pain, shortness of breath, nausea, vomiting, or diarrhea.  Denies dizziness, lightheadedness, pre syncopal or syncopal episodes.    Past Medical History:  Diagnosis Date   Allergy    Anxiety    Arthritis    Chronic low back pain    HTN (hypertension) 03/28/2016   Osteoarthritis of left hip    severe determined by x-ray    Past Surgical History:  Procedure Laterality Date   COSMETIC SURGERY  07/2018   tummy tuck and facelift    No family history on file.  Social History   Socioeconomic History   Marital status: Married    Spouse name: Not on file   Number of children: Not on file   Years of education: Not on file   Highest education level: Not on file  Occupational History   Occupation: semi retired  Tobacco Use   Smoking status: Never   Smokeless tobacco: Never  Vaping Use   Vaping Use: Never used  Substance and Sexual Activity   Alcohol use: No    Alcohol/week: 0.0 standard drinks   Drug use: No   Sexual activity: Not Currently  Other Topics Concern   Not on file  Social History Narrative   Not on file   Social Determinants of Health   Financial Resource Strain: Low Risk     Difficulty of Paying Living Expenses: Not hard at all  Food Insecurity: No Food Insecurity   Worried About Charity fundraiser in the Last Year: Never true   [place] in the Last Year: Never true  Transportation Needs: No Transportation Needs   Lack of Transportation (Medical): No   Lack of Transportation (Non-Medical): No  Physical Activity: Inactive   Days of Exercise per Week: 0 days   Minutes of Exercise per Session: 0 min  Stress: No Stress Concern Present   Feeling of Stress : Not at all  Social Connections: Not on file  Intimate Partner Violence: Not on file    Outpatient Medications Prior to Visit  Medication Sig Dispense Refill   montelukast (SINGULAIR) 10 MG tablet      oseltamivir (TAMIFLU) 75 MG capsule Take by mouth.     Pseudoephedrine-Guaifenesin [phone] MG TB12 Take by mouth.     albuterol (VENTOLIN HFA) 108 (90 Base) MCG/ACT inhaler TAKE 2 PUFFS BY MOUTH EVERY 6 HOURS AS NEEDED FOR WHEEZE OR SHORTNESS OF BREATH 18 g 1   ALPRAZolam (XANAX) 0.5 MG tablet Take 1 tablet (0.5 mg total) by mouth once as needed for up to 1 dose for anxiety. For flight anxiety. 12 tablet 0   citalopram (CELEXA) 20 MG  tablet Take 20 mg by mouth daily.     fluconazole (DIFLUCAN) 150 MG tablet Take 150 mg by mouth every 3 (three) days.     lisinopril-hydrochlorothiazide (ZESTORETIC) 10-12.5 MG tablet Take 1 tablet by mouth daily. PATIENT NEEDS AN APPOINTMENT (Patient not taking: Reported on 06/27/2020) 30 tablet 0   Olopatadine HCl 0.2 % SOLN Apply 1 drop to eye daily. 2.5 mL 1   progesterone (PROMETRIUM) 100 MG capsule Take 100 mg by mouth at bedtime.     Semaglutide,0.25 or 0.5MG /DOS, (OZEMPIC, 0.25 OR 0.5 MG/DOSE,) 2 MG/1.5ML SOPN Inject 0.5 mg into the skin once a week. 1.5 mL 1   azithromycin (ZITHROMAX) 250 MG tablet TAKE 2 TABLETS BY MOUTH TODAY, THEN TAKE 1 TABLET DAILY FOR 4 DAYS     guaiFENesin-codeine (ROBITUSSIN AC) 100-10 MG/5ML syrup Take 5 mLs by mouth 3 (three) times daily  as needed for cough. 120 mL 0   ivermectin (STROMECTOL) 3 MG TABS tablet Take 15 mg by mouth daily.     tranexamic acid (LYSTEDA) 650 MG TABS tablet SMARTSIG:2 By Mouth 3 Times Daily     triamcinolone (NASACORT) 55 MCG/ACT AERO nasal inhaler Place 1 spray into the nose 2 (two) times daily. 1 Inhaler 2   No facility-administered medications prior to visit.    No Known Allergies  Review of Systems  Constitutional:  Positive for fatigue. Negative for activity change, appetite change, chills, diaphoresis, fever and unexpected weight change.  HENT:  Positive for postnasal drip. Negative for congestion, nosebleeds and rhinorrhea.   Respiratory: Negative.    Cardiovascular: Negative.   Gastrointestinal: Negative.   Genitourinary: Negative.   Musculoskeletal: Negative.   Skin: Negative.   Neurological:  Positive for dizziness. Negative for tremors, seizures, syncope, facial asymmetry, speech difficulty, weakness, light-headedness, numbness and headaches.  Hematological: Negative.   Psychiatric/Behavioral:  Negative for self-injury, sleep disturbance and suicidal ideas.       Objective:    Physical Exam Vitals reviewed.  Constitutional:      General: She is not in acute distress.    Appearance: She is well-developed. She is obese. She is not ill-appearing, toxic-appearing or diaphoretic.     Interventions: She is not intubated.    Comments: Patient is alert and oriented and responsive to questions Engages in eye contact with provider. Speaks in full sentences without any pauses without any shortness of breath or distress.     HENT:     Head: Normocephalic and atraumatic.     Salivary Glands: Right salivary gland is not diffusely enlarged or tender. Left salivary gland is not diffusely enlarged or tender.     Right Ear: Hearing, ear canal and external ear normal. No swelling or tenderness. A middle ear effusion is present. There is no impacted cerumen. Tympanic membrane is not erythematous  or bulging.     Left Ear: Hearing, ear canal and external ear normal. No swelling or tenderness. A middle ear effusion is present. There is no impacted cerumen. Tympanic membrane is not erythematous or bulging.     Nose: Nose normal. No congestion or rhinorrhea.     Right Sinus: No maxillary sinus tenderness or frontal sinus tenderness.     Left Sinus: No maxillary sinus tenderness or frontal sinus tenderness.     Mouth/Throat:     Mouth: Mucous membranes are moist.     Pharynx: Oropharynx is clear. Uvula midline. No oropharyngeal exudate, posterior oropharyngeal erythema or uvula swelling.  Eyes:     General: Lids are  normal. Lids are everted, no foreign bodies appreciated. No scleral icterus.       Right eye: No discharge.        Left eye: No discharge.     Conjunctiva/sclera: Conjunctivae normal.     Right eye: Right conjunctiva is not injected. No exudate or hemorrhage.    Left eye: Left conjunctiva is not injected. No exudate or hemorrhage.    Pupils: Pupils are equal, round, and reactive to light.  Neck:     Thyroid: No thyroid mass or thyromegaly.     Vascular: Normal carotid pulses. No carotid bruit, hepatojugular reflux or JVD.     Trachea: Trachea and phonation normal. No tracheal tenderness or tracheal deviation.     Meningeal: Brudzinski's sign and Kernig's sign absent.  Cardiovascular:     Rate and Rhythm: Normal rate and regular rhythm.     Pulses: Normal pulses.          Radial pulses are 2+ on the right side and 2+ on the left side.       Dorsalis pedis pulses are 2+ on the right side and 2+ on the left side.       Posterior tibial pulses are 2+ on the right side and 2+ on the left side.     Heart sounds: Normal heart sounds, S1 normal and S2 normal. Heart sounds not distant. No murmur heard.   No friction rub. No gallop.  Pulmonary:     Effort: Pulmonary effort is normal. No tachypnea, bradypnea, accessory muscle usage or respiratory distress. She is not intubated.      Breath sounds: Normal breath sounds. No stridor. No wheezing, rhonchi or rales.  Chest:     Chest wall: No tenderness.  Abdominal:     General: Bowel sounds are normal. There is no distension or abdominal bruit.     Palpations: Abdomen is soft. There is no shifting dullness, fluid wave, hepatomegaly, splenomegaly, mass or pulsatile mass.     Tenderness: There is no abdominal tenderness. There is no right CVA tenderness, left CVA tenderness, guarding or rebound.     Hernia: No hernia is present.  Musculoskeletal:        General: No tenderness or deformity. Normal range of motion.     Cervical back: Full passive range of motion without pain, normal range of motion and neck supple. No edema, erythema, rigidity or tenderness. No spinous process tenderness or muscular tenderness. Normal range of motion.  Lymphadenopathy:     Head:     Right side of head: No submental, submandibular, tonsillar, preauricular, posterior auricular or occipital adenopathy.     Left side of head: No submental, submandibular, tonsillar, preauricular, posterior auricular or occipital adenopathy.     Cervical: No cervical adenopathy.     Right cervical: No superficial, deep or posterior cervical adenopathy.    Left cervical: No superficial, deep or posterior cervical adenopathy.     Upper Body:     Right upper body: No supraclavicular or pectoral adenopathy.     Left upper body: No supraclavicular or pectoral adenopathy.  Skin:    General: Skin is warm and dry.     Coloration: Skin is not jaundiced or pale.     Findings: No abrasion, bruising, burn, ecchymosis, erythema, lesion, petechiae or rash.     Nails: There is no clubbing.  Neurological:     General: No focal deficit present.     Mental Status: She is alert and oriented to person, place,  and time.     GCS: GCS eye subscore is 4. GCS verbal subscore is 5. GCS motor subscore is 6.     Cranial Nerves: No cranial nerve deficit.     Sensory: No sensory deficit.      Motor: No weakness, tremor, atrophy, abnormal muscle tone or seizure activity.     Coordination: Coordination normal.     Gait: Gait normal.     Deep Tendon Reflexes: Reflexes are normal and symmetric. Reflexes normal. Babinski sign absent on the right side. Babinski sign absent on the left side.     Comments: Grip equal and strong bilateral upper extremities. Gait strong and steady. Able to perform finger-to-nose without difficulty.    Psychiatric:        Mood and Affect: Mood normal.        Speech: Speech normal.        Behavior: Behavior normal.        Thought Content: Thought content normal.        Judgment: Judgment normal.    BP 122/82 (BP Location: Left Arm, Cuff Size: Large)    Pulse 76    Temp 98.4 F (36.9 C) (Oral)    Resp 14    Ht 5' 7.01" (1.702 m)    Wt 174 lb 6.4 oz (79.1 kg)    SpO2 97%    BMI 27.31 kg/m  Wt Readings from Last 3 Encounters:  02/12/21 174 lb 6.4 oz (79.1 kg)  01/16/21 179 lb (81.2 kg)  06/27/20 172 lb (78 kg)  Mildly orthostatic pressure changes.  No data found.   Health Maintenance Due  Topic Date Due   Pneumonia Vaccine 6+ Years old (1 - PCV) Never done   Hepatitis C Screening  Never done   TETANUS/TDAP  Never done   DEXA SCAN  Never done   Zoster Vaccines- Shingrix (2 of 2) 01/08/2020   INFLUENZA VACCINE  Never done    There are no preventive care reminders to display for this patient.   Lab Results  Component Value Date   TSH 1.90 01/16/2021   Lab Results  Component Value Date   WBC 9.7 02/13/2021   HGB 13.9 02/13/2021   HCT 40.5 02/13/2021   MCV 94.8 02/13/2021   PLT 245.0 02/13/2021   Lab Results  Component Value Date   NA 138 02/13/2021   K 3.9 02/13/2021   CO2 29 02/13/2021   GLUCOSE 90 02/13/2021   BUN 11 02/13/2021   CREATININE 0.91 02/13/2021   BILITOT 0.6 02/13/2021   ALKPHOS 50 02/13/2021   AST 21 02/13/2021   ALT 19 02/13/2021   PROT 6.6 02/13/2021   ALBUMIN 4.7 02/13/2021   CALCIUM 9.6 02/13/2021   GFR  65.05 02/13/2021   Lab Results  Component Value Date   CHOL 197 01/16/2021   Lab Results  Component Value Date   HDL 46.90 01/16/2021   Lab Results  Component Value Date   LDLCALC 114 (H) 01/16/2021   Lab Results  Component Value Date   TRIG 178.0 (H) 01/16/2021   Lab Results  Component Value Date   CHOLHDL 4 01/16/2021   Lab Results  Component Value Date   HGBA1C 5.7 01/16/2021       Assessment & Plan:   Problem List Items Addressed This Visit       Cardiovascular and Mediastinum   Hypertension - Primary   Relevant Orders   AMB Referral to [place]   AMB Referral to [facility]  Care Coordinaton     Nervous and Auditory   Eustachian tube dysfunction, bilateral   Relevant Medications   meclizine (ANTIVERT) 12.5 MG tablet   predniSONE (STERAPRED UNI-PAK 21 TAB) 10 MG (21) TBPK tablet   Other Relevant Orders   Comprehensive metabolic panel (Completed)   CBC with Differential/Platelet (Completed)   COVID-19, Flu A+B and RSV (Completed)     Other   Anxiety   Relevant Medications   citalopram (CELEXA) 20 MG tablet   Other Relevant Orders   AMB Referral to [place]   On postmenopausal hormone replacement therapy   Relevant Orders   AMB Referral to [facility]   History of bronchitis   Relevant Orders   AMB Referral to [facility]   BMI 28.0-28.9,adult   Relevant Orders   AMB Referral to [facility]   Prediabetes   Relevant Orders   AMB Referral to [facility] Coordinaton   Other Visit Diagnoses     Vertigo       Relevant Medications   meclizine (ANTIVERT) 12.5 MG tablet   Other Relevant Orders   COVID-19, Flu A+B and RSV (Completed)   Nasal congestion       Relevant Medications   fluticasone (FLONASE) 50 MCG/ACT nasal spray      The primary encounter diagnosis was Primary hypertension. Diagnoses of On postmenopausal hormone replacement therapy, BMI  28.0-28.9,adult, History of bronchitis, Anxiety, Hypertension, unspecified type, Prediabetes, Eustachian tube dysfunction, bilateral, Vertigo, and Nasal congestion were also pertinent to this visit.   Discussed questionable vertigo versus uncontrolled allergies. She is in no acute distress, is tired. Neurologically intact. We did discuss possiblity of a CT exam, she declines this at this time.  She will try Antivert, Flonase and prednisone taper pack. Increased hydration she was slightly orthostatic. ER precautions discussed and advised.   Meds ordered this encounter  Medications   meclizine (ANTIVERT) 12.5 MG tablet    Sig: Take 1 tablet (12.5 mg total) by mouth 3 (three) times daily as needed for dizziness (will cause drowsiness).    Dispense:  30 tablet    Refill:  0   fluticasone (FLONASE) 50 MCG/ACT nasal spray    Sig: Place 2 sprays into both nostrils daily.    Dispense:  16 g    Refill:  6   predniSONE (STERAPRED UNI-PAK 21 TAB) 10 MG (21) TBPK tablet    Sig: PO: Take 6 tablets on day 1:Take 5 tablets day 2:Take 4 tablets day 3: Take 3 tablets day 4:Take 2 tablets day five: 5 Take 1 tablet day 6    Dispense:  21 tablet    Refill:  0    Red Flags discussed. The patient was given clear instructions to go to ER or return to medical center if any red flags develop, symptoms do not improve, worsen or new problems develop. They verbalized understanding.  Return in about 1 week (around 02/19/2021), or if symptoms worsen or fail to improve, for at any time for any worsening symptoms, Go to Emergency room/ urgent care if worse.    Marcille Buffy, FNP

## 2021-02-13 ENCOUNTER — Other Ambulatory Visit (INDEPENDENT_AMBULATORY_CARE_PROVIDER_SITE_OTHER): Payer: Medicare Other

## 2021-02-13 ENCOUNTER — Telehealth: Payer: Self-pay

## 2021-02-13 DIAGNOSIS — R42 Dizziness and giddiness: Secondary | ICD-10-CM | POA: Diagnosis not present

## 2021-02-13 DIAGNOSIS — H6983 Other specified disorders of Eustachian tube, bilateral: Secondary | ICD-10-CM | POA: Diagnosis not present

## 2021-02-13 NOTE — Telephone Encounter (Signed)
NA

## 2021-02-14 ENCOUNTER — Encounter: Payer: Self-pay | Admitting: Adult Health

## 2021-02-14 LAB — COMPREHENSIVE METABOLIC PANEL
ALT: 19 U/L (ref 0–35)
AST: 21 U/L (ref 0–37)
Albumin: 4.7 g/dL (ref 3.5–5.2)
Alkaline Phosphatase: 50 U/L (ref 39–117)
BUN: 11 mg/dL (ref 6–23)
CO2: 29 mEq/L (ref 19–32)
Calcium: 9.6 mg/dL (ref 8.4–10.5)
Chloride: 101 mEq/L (ref 96–112)
Creatinine, Ser: 0.91 mg/dL (ref 0.40–1.20)
GFR: 65.05 mL/min (ref >60.00)
Glucose, Bld: 90 mg/dL (ref 70–99)
Potassium: 3.9 mEq/L (ref 3.5–5.1)
Sodium: 138 mEq/L (ref 135–145)
Total Bilirubin: 0.6 mg/dL (ref 0.2–1.2)
Total Protein: 6.6 g/dL (ref 6.0–8.3)

## 2021-02-14 LAB — CBC WITH DIFFERENTIAL/PLATELET
Basophils Absolute: 0.1 10*3/uL (ref 0.0–0.1)
Basophils Relative: 1.4 % (ref 0.0–3.0)
Eosinophils Absolute: 0.1 10*3/uL (ref 0.0–0.7)
Eosinophils Relative: 0.5 % (ref 0.0–5.0)
HCT: 40.5 % (ref 36.0–46.0)
Hemoglobin: 13.9 g/dL (ref 12.0–15.0)
Lymphocytes Relative: 43.6 % (ref 12.0–46.0)
Lymphs Abs: 4.2 10*3/uL — ABNORMAL HIGH (ref 0.7–4.0)
MCHC: 34.3 g/dL (ref 30.0–36.0)
MCV: 94.8 fl (ref 78.0–100.0)
Monocytes Absolute: 0.7 10*3/uL (ref 0.1–1.0)
Monocytes Relative: 6.8 % (ref 3.0–12.0)
Neutro Abs: 4.6 10*3/uL (ref 1.4–7.7)
Neutrophils Relative %: 47.7 % (ref 43.0–77.0)
Platelets: 245 10*3/uL (ref 150.0–400.0)
RBC: 4.27 Mil/uL (ref 3.87–5.11)
RDW: 13.6 % (ref 11.5–15.5)
WBC: 9.7 10*3/uL (ref 4.0–10.5)

## 2021-02-14 NOTE — Telephone Encounter (Signed)
If you do this you are responsible for any abnormal's because the orders will need to be in your name in Epic, plus some of these labs we do not handle in our lab they would have to go to Agency. Which may or may not be the same processing fee. WE cannot use hand written orders they have to be in system. If you order when ordering there is CC button to add this provider and labs will automatically result to you both but as I said if they are abnormal you would be responsible for follow up , two of our provider do not do this for that reason. Patient would probably be better off going to a [facility] some of the test I am not sure we have the correct tubes for.

## 2021-02-14 NOTE — Progress Notes (Signed)
Cbc ok neutrophil absolute scant increased, will recheck in 2 weeks, she could have had a viral illness causing symptoms. Increase hydration and rest,CMP within normal limits. Please se note on labs, office manager says that the labs need to be done at [facility] and we are not able to do in the office. My apologies.

## 2021-02-15 ENCOUNTER — Encounter: Payer: Self-pay | Admitting: Adult Health

## 2021-02-15 LAB — COVID-19, FLU A+B AND RSV
Influenza A, NAA: NOT DETECTED
Influenza B, NAA: NOT DETECTED
RSV, NAA: NOT DETECTED
SARS-CoV-2, NAA: NOT DETECTED

## 2021-02-15 NOTE — Progress Notes (Signed)
Negative covid, flu and rsv.

## 2021-02-17 ENCOUNTER — Telehealth: Payer: Self-pay

## 2021-02-17 NOTE — Telephone Encounter (Signed)
Lvm for patient to return call in regards to labs and discuss about lab requisition.

## 2021-02-18 ENCOUNTER — Telehealth: Payer: Self-pay

## 2021-02-18 NOTE — Telephone Encounter (Signed)
Called pt x2 lvm x2 in regards to her lab results and for her to come into office to have cbc rechecked in about 2 weeks. Indicated in vm for patient to increase hydration and rest. Also informed her that lab requisition that was sent over to [place] for review has to be done at [facility] since we are unable to draw those labs here. But did indicate that if patient has any other questions or concerns for her to reach out to [place].

## 2021-02-19 ENCOUNTER — Ambulatory Visit: Payer: Medicare Other | Admitting: Adult Health

## 2021-02-24 ENCOUNTER — Encounter: Payer: Self-pay | Admitting: Adult Health

## 2021-02-25 ENCOUNTER — Telehealth: Payer: Medicare Other | Admitting: Adult Health

## 2021-02-25 DIAGNOSIS — I788 Other diseases of capillaries: Secondary | ICD-10-CM | POA: Diagnosis not present

## 2021-02-25 DIAGNOSIS — L658 Other specified nonscarring hair loss: Secondary | ICD-10-CM | POA: Diagnosis not present

## 2021-02-25 DIAGNOSIS — L301 Dyshidrosis [pompholyx]: Secondary | ICD-10-CM | POA: Diagnosis not present

## 2021-02-26 ENCOUNTER — Telehealth: Payer: Medicare Other | Admitting: Adult Health

## 2021-03-28 ENCOUNTER — Other Ambulatory Visit: Payer: Self-pay | Admitting: Adult Health

## 2021-03-28 DIAGNOSIS — Z6828 Body mass index (BMI) 28.0-28.9, adult: Secondary | ICD-10-CM

## 2021-04-12 DIAGNOSIS — M25562 Pain in left knee: Secondary | ICD-10-CM | POA: Diagnosis not present

## 2021-04-16 ENCOUNTER — Encounter: Payer: Self-pay | Admitting: Internal Medicine

## 2021-04-16 ENCOUNTER — Ambulatory Visit (INDEPENDENT_AMBULATORY_CARE_PROVIDER_SITE_OTHER): Payer: Medicare Other | Admitting: Internal Medicine

## 2021-04-16 ENCOUNTER — Encounter: Payer: Medicare Other | Admitting: Adult Health

## 2021-04-16 VITALS — BP 124/84 | HR 65 | Temp 98.0°F | Resp 14 | Ht 67.01 in | Wt 176.6 lb

## 2021-04-16 DIAGNOSIS — Z6827 Body mass index (BMI) 27.0-27.9, adult: Secondary | ICD-10-CM

## 2021-04-16 DIAGNOSIS — Z1231 Encounter for screening mammogram for malignant neoplasm of breast: Secondary | ICD-10-CM | POA: Diagnosis not present

## 2021-04-16 DIAGNOSIS — R0989 Other specified symptoms and signs involving the circulatory and respiratory systems: Secondary | ICD-10-CM

## 2021-04-16 DIAGNOSIS — E2839 Other primary ovarian failure: Secondary | ICD-10-CM

## 2021-04-16 DIAGNOSIS — Z6828 Body mass index (BMI) 28.0-28.9, adult: Secondary | ICD-10-CM

## 2021-04-16 DIAGNOSIS — Z0100 Encounter for examination of eyes and vision without abnormal findings: Secondary | ICD-10-CM | POA: Diagnosis not present

## 2021-04-16 DIAGNOSIS — E785 Hyperlipidemia, unspecified: Secondary | ICD-10-CM | POA: Diagnosis not present

## 2021-04-16 DIAGNOSIS — Z1211 Encounter for screening for malignant neoplasm of colon: Secondary | ICD-10-CM | POA: Diagnosis not present

## 2021-04-16 DIAGNOSIS — F419 Anxiety disorder, unspecified: Secondary | ICD-10-CM

## 2021-04-16 DIAGNOSIS — J9801 Acute bronchospasm: Secondary | ICD-10-CM

## 2021-04-16 DIAGNOSIS — Z8709 Personal history of other diseases of the respiratory system: Secondary | ICD-10-CM | POA: Diagnosis not present

## 2021-04-16 DIAGNOSIS — R7303 Prediabetes: Secondary | ICD-10-CM

## 2021-04-16 DIAGNOSIS — J0100 Acute maxillary sinusitis, unspecified: Secondary | ICD-10-CM

## 2021-04-16 MED ORDER — MONTELUKAST SODIUM 10 MG PO TABS: 10.0000 mg | ORAL_TABLET | Freq: Every day | ORAL | 3 refills | Status: DC

## 2021-04-16 MED ORDER — ALBUTEROL SULFATE HFA 108 (90 BASE) MCG/ACT IN AERS: 1.0000 | INHALATION_SPRAY | Freq: Four times a day (QID) | RESPIRATORY_TRACT | 11 refills | Status: DC | PRN

## 2021-04-16 MED ORDER — ALPRAZOLAM 0.5 MG PO TABS: 0.5000 mg | ORAL_TABLET | Freq: Every day | ORAL | 0 refills | Status: DC | PRN

## 2021-04-16 MED ORDER — OZEMPIC (0.25 OR 0.5 MG/DOSE) 2 MG/1.5ML ~~LOC~~ SOPN: 0.5000 mg | PEN_INJECTOR | SUBCUTANEOUS | 2 refills | Status: DC

## 2021-04-16 NOTE — Patient Instructions (Addendum)
Dr. Alvan Dame  ?Pietro Cassis Alvan Dame, MD ?4.9 ?([id number]) ? Orthopedic surgeon ?[place], [place] ? [phone] ?Open ? Closes 5?PM ? ?[facility] ?[address] ?[address] ?[place], [place] [address] ?[phone] ? ?B12 1000 mcg daily otc  ?Vitamin D3 4000 Iu daily  ? ?The O shot ask Dr. Landry Mellow if she does it otherwise his clinic is [facility] in [place] Dr. Juanna Cao  ? ?Call and schedule mammogram/bone density  ? ?Dr. Cletis Media -[facility] ?Address: [address], [place], [place] [address] ?Phone: [phone] ? ?Vibegron Oral Tablets-for overactive bladder  ?What is this medication? ?VIBEGRON (vye BEG ron) is used to treat overactive bladder. This medicine reduces the amount of bathroom visits. It may also help to control wetting accidents. ?This medicine may be used for other purposes; ask your health care provider or pharmacist if you have questions. ?COMMON BRAND NAME(S): GEMTESA ?What should I tell my care team before I take this medication? ?They need to know if you have any of these conditions: ?kidney disease ?liver disease ?prostate disease ?trouble passing urine ?an unusual or allergic reaction to vibegron, other medicines, foods, dyes, or preservatives ?pregnant or trying to get pregnant ?breast-feeding ?How should I use this medication? ?Take this medicine by mouth with water. Take it as directed on the prescription label at the same time every day. Swallow the tablets whole. You can take it with or without food. If it upsets your stomach, take it with food. You may also crush the tablet and put the contents in 1 tablespoon (15 mL) of applesauce. Swallow the medicine and applesauce right away. Take your medicine at regular intervals. Do not take it more often than directed. Keep taking it unless your health care provider tells you to stop. ?Talk to your pediatrician regarding the use of this medicine in children. Special care may be needed. ?Overdosage: If you think you have taken too much of  this medicine contact a poison control center or emergency room at once. ?NOTE: This medicine is only for you. Do not share this medicine with others. ?What if I miss a dose? ?If you miss a dose, take it as soon as you can. If it is almost time for your next dose, take only that dose. Do not take double or extra doses. ?What may interact with this medication? ?digoxin ?This list may not describe all possible interactions. Give your health care provider a list of all the medicines, herbs, non-prescription drugs, or dietary supplements you use. Also tell them if you smoke, drink alcohol, or use illegal drugs. Some items may interact with your medicine. ?What should I watch for while using this medication? ?Visit your health care provider for regular checks on your progress. Tell your health care provider if your symptoms do not start to get better or if they get worse. ?You may need to limit your intake of tea, coffee, caffeinated sodas, or alcohol. These drinks may make your symptoms worse. ?What side effects may I notice from receiving this medication? ?Side effects that you should report to your doctor or health care professional as soon as possible: ?allergic reactions like skin rash, itching or hives, swelling of the face, lips, or tongue ?infection (fever, chills, cough, sore throat, pain or trouble passing urine) ?trouble passing urine or change in the amount of urine ?Side effects that usually do not require medical attention (report these to your doctor or health care professional if they continue or are bothersome): ?constipation ?diarrhea ?dry mouth ?  headache ?nasal congestion (runny or stuffy nose) ?nausea ?sore throat ?This list may not describe all possible side effects. Call your doctor for medical advice about side effects. You may report side effects to FDA at 1-800-FDA-1088. ?Where should I keep my medication? ?Keep out of the reach of children and pets. ?Store at room temperature between 20 and 25  degrees C (68 and 77 degrees F). ?NOTE: This sheet is a summary. It may not cover all possible information. If you have questions about this medicine, talk to your doctor, pharmacist, or health care provider. ?? 2022 Elsevier/Gold Standard (2020-09-17 00:00:00) ? ?Kegel Exercises ?Kegel exercises can help strengthen your pelvic floor muscles. The pelvic floor is a group of muscles that support your rectum, small intestine, and bladder. In females, pelvic floor muscles also help support the uterus. These muscles help you control the flow of urine and stool (feces). ?Kegel exercises are painless and simple. They do not require any equipment. Your provider may suggest Kegel exercises to: ?Improve bladder and bowel control. ?Improve sexual response. ?Improve weak pelvic floor muscles after surgery to remove the uterus (hysterectomy) or after pregnancy, in females. ?Improve weak pelvic floor muscles after prostate gland removal or surgery, in males. ?Kegel exercises involve squeezing your pelvic floor muscles. These are the same muscles you squeeze when you try to stop the flow of urine or keep from passing gas. The exercises can be done while sitting, standing, or lying down, but it is best to vary your position. ?Ask your health care provider which exercises are safe for you. Do exercises exactly as told by your health care provider and adjust them as directed. Do not begin these exercises until told by your health care provider. ?Exercises ?How to do Kegel exercises: ?Squeeze your pelvic floor muscles tight. You should feel a tight lift in your rectal area. If you are a female, you should also feel a tightness in your vaginal area. Keep your stomach, buttocks, and legs relaxed. ?Hold the muscles tight for up to 10 seconds. ?Breathe normally. ?Relax your muscles for up to 10 seconds. ?Repeat as told by your health care provider. ?Repeat this exercise daily as told by your health care provider. Continue to do this  exercise for at least 4-6 weeks, or for as long as told by your health care provider. ?You may be referred to a physical therapist who can help you learn more about how to do Kegel exercises. ?Depending on your condition, your health care provider may recommend: ?Varying how long you squeeze your muscles. ?Doing several sets of exercises every day. ?Doing exercises for several weeks. ?Making Kegel exercises a part of your regular exercise routine. ?This information is not intended to replace advice given to you by your health care provider. Make sure you discuss any questions you have with your health care provider. ?Document Revised: 05/09/2020 Document Reviewed: 05/09/2020 ?Elsevier Patient Education ? [place]. ? ? ?Overactive Bladder, Adult ?Overactive bladder is a condition in which a person has a sudden and frequent need to urinate. A person might also leak urine if he or she cannot get to the bathroom fast enough (urinary incontinence). Sometimes, symptoms can interfere with work or social activities. ?What are the causes? ?Overactive bladder is associated with poor nerve signals between your bladder and your brain. Your bladder may get the signal to empty before it is full. You may also have very sensitive muscles that make your bladder squeeze too soon. This condition may also be  caused by other factors, such as: ?Medical conditions: ?Urinary tract infection. ?Infection of nearby tissues. ?Prostate enlargement. ?Bladder stones, inflammation, or tumors. ?Diabetes. ?Muscle or nerve weakness, especially from these conditions: ?A spinal cord injury. ?Stroke. ?Multiple sclerosis. ?Parkinson's disease. ?Other causes: ?Surgery on the uterus or urethra. ?Drinking too much caffeine or alcohol. ?Certain medicines, especially those that eliminate extra fluid in the body (diuretics). ?Constipation. ?What increases the risk? ?You may be at greater risk for overactive bladder if you: ?Are an older  adult. ?Smoke. ?Are going through menopause. ?Have prostate problems. ?Have a neurological disease, such as stroke, dementia, Parkinson's disease, or multiple sclerosis (MS). ?Eat or drink alcohol, spicy food, caffeine,

## 2021-04-16 NOTE — Progress Notes (Signed)
Chief Complaint  ?Patient presents with  ? Transitions Of Care  ?  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 Has appt scheduled with orthopedic surgeon next wk. [facility] and was given prednisone to treat, has 2 days worth left. Requesting refill on Xanax & Singulair  ? ?TOC former Charlene Cox pt np  ?1. C/o left knee pain sch f/u Dr. Alferd Apa ortho surgery next week went to urgent care and given prednisone and pain improved after 2 days 1/10 pain today  ?2. She wants to see the eye md will refer to [facility]  ?3. Of note had HRT 08/2020 to help with energy levels in the past recently had filler injections w/in this week ?4. Anxiety needs refills of xanax 0.5 mg prn and celexa 20 mg qd  ?5. H/o bronchitis/allergies needs refill of albuterol inhaler  ? ? ?Review of Systems  ?Constitutional:  Negative for weight loss.  ?HENT:  Negative for hearing loss.   ?Eyes:  Negative for blurred vision.  ?Respiratory:  Negative for shortness of breath.   ?Cardiovascular:  Negative for chest pain.  ?Gastrointestinal:  Negative for abdominal pain and blood in stool.  ?Genitourinary:  Negative for dysuria.  ?Musculoskeletal:  Positive for joint pain. Negative for falls.  ?Skin:  Negative for rash.  ?Neurological:  Negative for headaches.  ?Psychiatric/Behavioral:  Negative for depression.   ?Past Medical History:  ?Diagnosis Date  ? Allergy   ? Anxiety   ? needs with flying, bloodwork  ? Arthritis   ? Chronic low back pain   ? COVID-19   ? x2  ? HTN (hypertension) 03/28/2016  ? Osteoarthritis of left hip   ? severe determined by x-ray  ? ?Past Surgical History:  ?Procedure Laterality Date  ? COSMETIC SURGERY  07/2018  ? tummy tuck and facelift  ? ?No family history on file. ?Social History  ? ?Socioeconomic History  ? Marital status: Married  ?  Spouse name: Not on file  ? Number of children: Not on file  ? Years of education: Not on file  ? Highest education level: Not on file   ?Occupational History  ? Occupation: semi retired  ?Tobacco Use  ? Smoking status: Never  ? Smokeless tobacco: Never  ?Vaping Use  ? Vaping Use: Never used  ?Substance and Sexual Activity  ? Alcohol use: No  ?  Alcohol/week: 0.0 standard drinks  ? Drug use: No  ? Sexual activity: Not Currently  ?Other Topics Concern  ? Not on file  ?Social History Narrative  ? Not on file  ? ?Social Determinants of Health  ? ?Financial Resource Strain: Low Risk   ? Difficulty of Paying Living Expenses: Not hard at all  ?Food Insecurity: No Food Insecurity  ? Worried About Charity fundraiser in the Last Year: Never true  ? Ran Out of Food in the Last Year: Never true  ?Transportation Needs: No Transportation Needs  ? Lack of Transportation (Medical): No  ? Lack of Transportation (Non-Medical): No  ?Physical Activity: Inactive  ? Days of Exercise per Week: 0 days  ? Minutes of Exercise per Session: 0 min  ?Stress: No Stress Concern Present  ? Feeling of Stress : Not at all  ?Social Connections: Not on file  ?Intimate Partner Violence: Not on file  ? ?Current Meds  ?Medication Sig  ? albuterol (PROAIR HFA) 108 (90 Base) MCG/ACT inhaler Inhale 1-2 puffs into the lungs every  6 (six) hours as needed for wheezing or shortness of breath.  ? citalopram (CELEXA) 20 MG tablet Take 20 mg by mouth daily.  ? fluticasone (FLONASE) 50 MCG/ACT nasal spray Place 2 sprays into both nostrils daily.  ? meclizine (ANTIVERT) 12.5 MG tablet Take 1 tablet (12.5 mg total) by mouth 3 (three) times daily as needed for dizziness (will cause drowsiness).  ? [DISCONTINUED] ALPRAZolam (XANAX) 0.5 MG tablet Take 1 tablet (0.5 mg total) by mouth once as needed for up to 1 dose for anxiety. For flight anxiety.  ? [DISCONTINUED] fluconazole (DIFLUCAN) 150 MG tablet Take 150 mg by mouth every 3 (three) days.  ? [DISCONTINUED] montelukast (SINGULAIR) 10 MG tablet   ? [DISCONTINUED] OZEMPIC, 0.25 OR 0.5 MG/DOSE, 2 MG/1.5ML SOPN INJECT 0.5 MG INTO THE SKIN ONCE A WEEK.   ? [DISCONTINUED] predniSONE (STERAPRED UNI-PAK 21 TAB) 10 MG (21) TBPK tablet PO: Take 6 tablets on day 1:Take 5 tablets day 2:Take 4 tablets day 3: Take 3 tablets day 4:Take 2 tablets day five: 5 Take 1 tablet day 6 (Patient not taking: Reported on 04/29/2021)  ? ?Allergies  ?Allergen Reactions  ? Miralax [Polyethylene Glycol] Other (See Comments)  ?  ?reaction stomach cramp per patient.  ? ?  ? ?Recent Results (from the past 2160 hour(s))  ?COVID-19, Flu A+B and RSV     Status: None  ? Collection Time: 02/13/21  3:07 PM  ? Specimen: Nasal Swab  ? Nasal Swab  Previously  ?Result Value Ref Range  ? SARS-CoV-2, NAA Not Detected Not Detected  ? Influenza A, NAA Not Detected Not Detected  ? Influenza B, NAA Not Detected Not Detected  ? RSV, NAA Not Detected Not Detected  ? Test Information: Comment   ?  Comment: This nucleic acid amplification test was developed and its performance ?characteristics determined by [employer]. Nucleic acid ?amplification tests include RT-PCR and TMA. This test has not been ?FDA cleared or approved. This test has been authorized by FDA under ?an Emergency Use Authorization (EUA). This test is only authorized ?for the duration of time the declaration that circumstances exist ?justifying the authorization of the emergency use of in vitro ?diagnostic tests for detection of SARS-CoV-2 virus and/or diagnosis ?of COVID-19 infection under section 564(b)(1) of the Act, 21 U.S.C. ?360bbb-3(b) (1), unless the authorization is terminated or revoked ?sooner. ?When diagnostic testing is negative, the possibility of a false ?negative result should be considered in the context of a patient's ?recent exposures and the presence of clinical signs and symptoms ?consistent with COVID-19. An individual without symptoms of COVID-19 ?and who is not shedding SARS-CoV-2 virus wo uld expect to have a ?negative (not detected) result in this assay. ?  ?CBC with Differential/Platelet     Status: Abnormal  ?  Collection Time: 02/13/21  3:07 PM  ?Result Value Ref Range  ? WBC 9.7 4.0 - 10.5 K/uL  ? RBC 4.27 3.87 - 5.11 Mil/uL  ? Hemoglobin 13.9 12.0 - 15.0 g/dL  ? HCT 40.5 36.0 - 46.0 %  ? MCV 94.8 78.0 - 100.0 fl  ? MCHC 34.3 30.0 - 36.0 g/dL  ? RDW 13.6 11.5 - 15.5 %  ? Platelets 245.0 150.0 - 400.0 K/uL  ? Neutrophils Relative % 47.7 43.0 - 77.0 %  ? Lymphocytes Relative 43.6 12.0 - 46.0 %  ? Monocytes Relative 6.8 3.0 - 12.0 %  ? Eosinophils Relative 0.5 0.0 - 5.0 %  ? Basophils Relative 1.4 0.0 - 3.0 %  ?  Neutro Abs 4.6 1.4 - 7.7 K/uL  ? Lymphs Abs 4.2 (H) 0.7 - 4.0 K/uL  ? Monocytes Absolute 0.7 0.1 - 1.0 K/uL  ? Eosinophils Absolute 0.1 0.0 - 0.7 K/uL  ? Basophils Absolute 0.1 0.0 - 0.1 K/uL  ?Comprehensive metabolic panel     Status: None  ? Collection Time: 02/13/21  3:07 PM  ?Result Value Ref Range  ? Sodium 138 135 - 145 mEq/L  ? Potassium 3.9 3.5 - 5.1 mEq/L  ? Chloride 101 96 - 112 mEq/L  ? CO2 29 19 - 32 mEq/L  ? Glucose, Bld 90 70 - 99 mg/dL  ? BUN 11 6 - 23 mg/dL  ? Creatinine, Ser 0.91 0.40 - 1.20 mg/dL  ? Total Bilirubin 0.6 0.2 - 1.2 mg/dL  ? Alkaline Phosphatase 50 39 - 117 U/L  ? AST 21 0 - 37 U/L  ? ALT 19 0 - 35 U/L  ? Total Protein 6.6 6.0 - 8.3 g/dL  ? Albumin 4.7 3.5 - 5.2 g/dL  ? GFR 65.05 >60.00 mL/min  ?  Comment: Calculated using the CKD-EPI Creatinine Equation (2021)  ? Calcium 9.6 8.4 - 10.5 mg/dL  ?Urine Culture     Status: None  ? Collection Time: 04/28/21 11:03 AM  ? Specimen: Urine  ?Result Value Ref Range  ? MICRO NUMBER: [id number]   ? SPECIMEN QUALITY: Adequate   ? Sample Source NOT GIVEN   ? STATUS: FINAL   ? Result: No Growth   ?Urinalysis, Routine w reflex microscopic     Status: Abnormal  ? Collection Time: 04/28/21 11:03 AM  ?Result Value Ref Range  ? Color, Urine DARK YELLOW YELLOW  ? APPearance CLEAR CLEAR  ? Specific Gravity, Urine 1.023 1.001 - 1.035  ? pH < OR = 5.0 5.0 - 8.0  ? Glucose, UA NEGATIVE NEGATIVE  ? Bilirubin Urine NEGATIVE NEGATIVE  ? Ketones, ur TRACE (A)  NEGATIVE  ? Hgb urine dipstick NEGATIVE NEGATIVE  ? Protein, ur TRACE (A) NEGATIVE  ? Nitrite NEGATIVE NEGATIVE  ? Leukocytes,Ua 2+ (A) NEGATIVE  ? WBC, UA 20-40 (A) 0 - 5 /HPF  ? RBC / HPF 3-10 (A) 0 - 2 /HPF

## 2021-04-22 DIAGNOSIS — Z0189 Encounter for other specified special examinations: Secondary | ICD-10-CM | POA: Diagnosis not present

## 2021-04-22 DIAGNOSIS — M1612 Unilateral primary osteoarthritis, left hip: Secondary | ICD-10-CM | POA: Diagnosis not present

## 2021-04-22 DIAGNOSIS — M1712 Unilateral primary osteoarthritis, left knee: Secondary | ICD-10-CM | POA: Diagnosis not present

## 2021-04-22 DIAGNOSIS — Z8781 Personal history of (healed) traumatic fracture: Secondary | ICD-10-CM | POA: Diagnosis not present

## 2021-04-24 ENCOUNTER — Encounter: Payer: Self-pay | Admitting: Internal Medicine

## 2021-04-25 ENCOUNTER — Encounter: Payer: Self-pay | Admitting: Internal Medicine

## 2021-04-25 ENCOUNTER — Telehealth (INDEPENDENT_AMBULATORY_CARE_PROVIDER_SITE_OTHER): Payer: Medicare Other | Admitting: Internal Medicine

## 2021-04-25 DIAGNOSIS — R053 Chronic cough: Secondary | ICD-10-CM

## 2021-04-25 DIAGNOSIS — J40 Bronchitis, not specified as acute or chronic: Secondary | ICD-10-CM

## 2021-04-25 DIAGNOSIS — Z01818 Encounter for other preprocedural examination: Secondary | ICD-10-CM | POA: Diagnosis not present

## 2021-04-25 DIAGNOSIS — N3000 Acute cystitis without hematuria: Secondary | ICD-10-CM

## 2021-04-25 DIAGNOSIS — B3731 Acute candidiasis of vulva and vagina: Secondary | ICD-10-CM | POA: Diagnosis not present

## 2021-04-25 DIAGNOSIS — D72829 Elevated white blood cell count, unspecified: Secondary | ICD-10-CM | POA: Diagnosis not present

## 2021-04-25 DIAGNOSIS — R197 Diarrhea, unspecified: Secondary | ICD-10-CM | POA: Diagnosis not present

## 2021-04-25 MED ORDER — METHYLPREDNISOLONE 4 MG PO TBPK
ORAL_TABLET | ORAL | 0 refills | Status: DC
Start: 2021-04-25 — End: 2021-04-29

## 2021-04-25 MED ORDER — HYDROCOD POLI-CHLORPHE POLI ER 10-8 MG/5ML PO SUER: 5.0000 mL | Freq: Every evening | ORAL | 0 refills | Status: DC | PRN

## 2021-04-25 MED ORDER — DM-GUAIFENESIN ER 30-600 MG PO TB12: 1.0000 | ORAL_TABLET | Freq: Two times a day (BID) | ORAL | 0 refills | Status: DC | PRN

## 2021-04-25 MED ORDER — FLUCONAZOLE 150 MG PO TABS: 150.0000 mg | ORAL_TABLET | Freq: Once | ORAL | 0 refills | Status: AC

## 2021-04-25 MED ORDER — AZITHROMYCIN 250 MG PO TABS: ORAL_TABLET | ORAL | 0 refills | Status: DC

## 2021-04-25 NOTE — Progress Notes (Addendum)
Virtual Visit via Video Note ? ?I connected with Charlene Cox ? on 04/25/21 at  4:20 PM EDT by a video enabled telemedicine application and verified that I am speaking with the correct person using two identifiers. ? Location patient: [place] ?Location provider:work or home office ?Persons participating in the virtual visit: patient, provider ? ?I discussed the limitations and requested verbal permission for telemedicine visit. The patient expressed understanding and agreed to proceed. ? ? ?HPI: ? ?Acute telemedicine visit for : ?Cough since last office visit with wheezing and elevate WBC at ortho appt this week. ?Knee surgery was sch 05/28/21 now moved back due to wheezing and elevated WBC ?She has tried albuterol inhaler w/o relief  ?Home covid test negative cxr 01/2021 negative  ? ?Still having urinary frequency  ?-Pertinent past medical history: see below ?-Pertinent medication allergies: ?Allergies  ?Allergen Reactions  ? Miralax [Polyethylene Glycol]   ?  ?reaction ?  ? ?-COVID-19 vaccine status:  ?Immunization History  ?Administered Date(s) Administered  ? PFIZER(Purple Top)SARS-COV-2 Vaccination 02/09/2019, 03/09/2019, 11/13/2019  ? Pension scheme manager 36yr & up 11/07/2020  ? Zoster Recombinat (Shingrix) 11/13/2019  ? ? ? ?ROS: See pertinent positives and negatives per HPI. ? ?Past Medical History:  ?Diagnosis Date  ? Allergy   ? Anxiety   ? needs with flying, bloodwork  ? Arthritis   ? Chronic low back pain   ? COVID-19   ? x2  ? HTN (hypertension) 03/28/2016  ? Osteoarthritis of left hip   ? severe determined by x-ray  ? ? ?Past Surgical History:  ?Procedure Laterality Date  ? COSMETIC SURGERY  07/2018  ? tummy tuck and facelift  ? ? ? ?Current Outpatient Medications:  ?  albuterol (PROAIR HFA) 108 (90 Base) MCG/ACT inhaler, Inhale 1-2 puffs into the lungs every 6 (six) hours as needed for wheezing or shortness of breath., Disp: 18 g, Rfl: 11 ?  ALPRAZolam (XANAX) 0.5 MG tablet, Take 1  tablet (0.5 mg total) by mouth daily as needed for up to 1 dose for anxiety. For flight anxiety., Disp: 30 tablet, Rfl: 0 ?  azithromycin (ZITHROMAX) 250 MG tablet, Take 2 tablets on day 1, then 1 tablet daily on days 2 through 5, Disp: 6 tablet, Rfl: 0 ?  chlorpheniramine-HYDROcodone (TUSSIONEX PENNKINETIC ER) 10-8 MG/5ML, Take 5 mLs by mouth at bedtime as needed., Disp: 115 mL, Rfl: 0 ?  citalopram (CELEXA) 20 MG tablet, Take 20 mg by mouth daily., Disp: , Rfl:  ?  dextromethorphan-guaiFENesin (MUCINEX DM) 30-600 MG 12hr tablet, Take 1 tablet by mouth 2 (two) times daily as needed for cough., Disp: 60 tablet, Rfl: 0 ?  fluconazole (DIFLUCAN) 150 MG tablet, Take 1 tablet (150 mg total) by mouth once for 1 dose. Repeat in 3 days if needed, Disp: 2 tablet, Rfl: 0 ?  fluticasone (FLONASE) 50 MCG/ACT nasal spray, Place 2 sprays into both nostrils daily., Disp: 16 g, Rfl: 6 ?  meclizine (ANTIVERT) 12.5 MG tablet, Take 1 tablet (12.5 mg total) by mouth 3 (three) times daily as needed for dizziness (will cause drowsiness)., Disp: 30 tablet, Rfl: 0 ?  methylPREDNISolone (MEDROL DOSEPAK) 4 MG TBPK tablet, Use as directed take in the AM with food, Disp: 21 tablet, Rfl: 0 ?  montelukast (SINGULAIR) 10 MG tablet, Take 1 tablet (10 mg total) by mouth at bedtime., Disp: 90 tablet, Rfl: 3 ?  predniSONE (STERAPRED UNI-PAK 21 TAB) 10 MG (21) TBPK tablet, PO: Take 6 tablets on day 1:Take  5 tablets day 2:Take 4 tablets day 3: Take 3 tablets day 4:Take 2 tablets day five: 5 Take 1 tablet day 6, Disp: 21 tablet, Rfl: 0 ?  Semaglutide,0.25 or 0.'5MG'$ /DOS, (OZEMPIC, 0.25 OR 0.5 MG/DOSE,) 2 MG/1.5ML SOPN, Inject 0.5 mg into the skin once a week., Disp: 1.5 mL, Rfl: 2 ?  Olopatadine HCl 0.2 % SOLN, Apply 1 drop to eye daily. (Patient not taking: Reported on 04/16/2021), Disp: 2.5 mL, Rfl: 1 ?  progesterone (PROMETRIUM) 100 MG capsule, Take 100 mg by mouth at bedtime. (Patient not taking: Reported on 04/16/2021), Disp: , Rfl:  ? ?EXAM: ? ?VITALS  per patient if applicable: ? ?GENERAL: alert, oriented, appears well and in no acute distress ? ?HEENT: atraumatic, conjunttiva clear, no obvious abnormalities on inspection of external nose and ears ? ?NECK: normal movements of the head and neck ? ?LUNGS: on inspection no signs of respiratory distress, breathing rate appears normal, no obvious gross SOB, gasping or wheezing ? ?CV: no obvious cyanosis ? ?MS: moves all visible extremities without noticeable abnormality ? ?PSYCH/NEURO: pleasant and cooperative, no obvious depression or anxiety, speech and thought processing grossly intact ? ?ASSESSMENT AND PLAN: ? ?Discussed the following assessment and plan: ? ?Bronchitis - Plan: dextromethorphan-guaiFENesin (MUCINEX DM) 30-600 MG 12hr tablet bid, chlorpheniramine-HYDROcodone (TUSSIONEX PENNKINETIC ER) 10-8 MG/5ML, azithromycin (ZITHROMAX) 250 MG tablet, methylPREDNISolone (MEDROL DOSEPAK) 4 MG TBPK tablet ?She will let me know what her WBC lab result was with ortho  ?Cxr 01/16/21 negative  ?Consider CT chest in the future if wheezing continues ? ?Acute cystitis without hematuria - Plan: Urinalysis, Routine w reflex microscopic, Urine Culture ?Lab appt Monday at 11 am  ? ? ?Yeast vaginitis if needed- Plan: fluconazole (DIFLUCAN) 150 MG tablet ? ? ?Left total hip postpone from 05/28/21 to a later date Dr[facility] in [place]  ?Per pt WBC elevated with his labs and needs to f/u with PCP which is part of reason for visit today and URI sx's  ?Also placed on steroids 04/25/21 so likely will have persistent elevated WBC  ?Cxr 01/16/21  ?FINDINGS: ?The heart size and mediastinal contours are within normal limits. ?Both lungs are clear. The visualized skeletal structures are ?unremarkable. ?  ?IMPRESSION: ?No active cardiopulmonary disease. ? ?Ordered labs for 04/25/21 cmet, cbc, pt/inr, UA/culture ?Will need another visit to auscultate lungs and do EKG ? ?A1c 01/16/21 5.7  ?  ?Electronically Signed ?  By: Ronney Asters  M.D. ?  On: 01/16/2021 21:48 ? ?-we discussed possible serious and likely etiologies, options for evaluation and workup, limitations of telemedicine visit vs in person visit, treatment, treatment risks and precautions. Pt is agreeable to treatment via telemedicine at this moment.  ? ?I discussed the assessment and treatment plan with the patient. The patient was provided an opportunity to ask questions and all were answered. The patient agreed with the plan and demonstrated an understanding of the instructions. ?  ? ?Time spent 20 minutes ?Nino Glow McLean-Scocuzza, MD   ?

## 2021-04-28 ENCOUNTER — Telehealth: Payer: Self-pay | Admitting: Internal Medicine

## 2021-04-28 ENCOUNTER — Other Ambulatory Visit (INDEPENDENT_AMBULATORY_CARE_PROVIDER_SITE_OTHER): Payer: Medicare Other

## 2021-04-28 DIAGNOSIS — N3000 Acute cystitis without hematuria: Secondary | ICD-10-CM | POA: Diagnosis not present

## 2021-04-28 NOTE — Addendum Note (Signed)
Addended by: Orland Mustard on: 04/28/2021 01:53 AM ? ? Modules accepted: Orders ? ?

## 2021-04-28 NOTE — Telephone Encounter (Signed)
Did pt do covid testing over the weekend?  ?Lab for urine schedule 04/28/21 at 11 am but also she needs pre op labs for knee surgery  ?Can this be scheduled as well?  ? ? ?How is she feeling ? ?Inform yes I did receive paperwork from ortho  ? ?She will likely need appt with me in person for pre-op clearance after labs and have EKG at this time as well  ? ? ?

## 2021-04-29 ENCOUNTER — Ambulatory Visit: Payer: Medicare Other | Admitting: Pulmonary Disease

## 2021-04-29 ENCOUNTER — Encounter: Payer: Self-pay | Admitting: Pulmonary Disease

## 2021-04-29 ENCOUNTER — Encounter: Payer: Self-pay | Admitting: Internal Medicine

## 2021-04-29 VITALS — BP 128/72 | HR 77 | Temp 98.0°F | Ht 67.0 in | Wt 178.0 lb

## 2021-04-29 DIAGNOSIS — R0602 Shortness of breath: Secondary | ICD-10-CM

## 2021-04-29 DIAGNOSIS — R0989 Other specified symptoms and signs involving the circulatory and respiratory systems: Secondary | ICD-10-CM | POA: Diagnosis not present

## 2021-04-29 DIAGNOSIS — R051 Acute cough: Secondary | ICD-10-CM

## 2021-04-29 LAB — URINALYSIS, ROUTINE W REFLEX MICROSCOPIC
Bilirubin Urine: NEGATIVE
Glucose, UA: NEGATIVE
Hgb urine dipstick: NEGATIVE
Nitrite: NEGATIVE
Specific Gravity, Urine: 1.023 (ref 1.001–1.035)
pH: <=5 (ref 5.0–8.0)

## 2021-04-29 LAB — URINE CULTURE
MICRO NUMBER:: 13272588
Result:: NO GROWTH
SPECIMEN QUALITY:: ADEQUATE

## 2021-04-29 LAB — MICROSCOPIC MESSAGE

## 2021-04-29 MED ORDER — PREDNISONE 10 MG PO TABS: 10.0000 mg | ORAL_TABLET | Freq: Every day | ORAL | 0 refills | Status: DC

## 2021-04-29 NOTE — Patient Instructions (Signed)
Complete your course of antibiotics- ?-Augmentin and Z-Pak ?-Prednisone 10 mg daily for about 3 to 4 days and then you can go to every other day you may stop after about 7 days in total ? ?-As long as you are feeling better day by day, nothing to be concerned about ? ?-Use your inhaler as we reviewed today ? ?-He does take some time before inflammation in the lungs settle down, anytime there is some inflammation in the lungs but his natural response is to try and cough it out ? ?-Mucinex can help make phlegm loose enough to be able to clear it better ? ?-Tentatively we will see you back in about 3 to 4 weeks ? ? ?

## 2021-04-29 NOTE — Progress Notes (Signed)
? ?      ?Charlene Cox    [id number]    [birth year] ? ?Primary Care Physician:McLean-Scocuzza, Nino Glow, MD ? ?Referring Physician: McLean-Scocuzza, Nino Glow, MD ?[address] ?[place],  [place] [address] ? ?Chief complaint:   ?Patient is being seen for an acute cough, shortness of breath, congestion, wheezing ? ?HPI: ? ?Cough started following a recent trip to [facility], ? ?She has used a course of steroids, 3 courses of different antibiotics but she did not complete the course of antibiotics ?She is on a rescue inhaler ? ?Denies underlying lung disease ?No history of smoking ?No pertinent occupational history ? ?Recently has been on prednisone, Z-Pak, Singulair, cefdinir, Augmentin, hydrocodone ? ?Remains short of breath with a cough and congestion ? ?Has followed up with primary doctor multiple times ? ?Recent chest x-ray was negative ? ?she was recently being worked up for possible hip surgery-this is on hold at present ? ? ?Outpatient Encounter Medications as of 04/29/2021  ?Medication Sig  ? albuterol (PROAIR HFA) 108 (90 Base) MCG/ACT inhaler Inhale 1-2 puffs into the lungs every 6 (six) hours as needed for wheezing or shortness of breath.  ? ALPRAZolam (XANAX) 0.5 MG tablet Take 1 tablet (0.5 mg total) by mouth daily as needed for up to 1 dose for anxiety. For flight anxiety.  ? cefdinir (OMNICEF) 300 MG capsule Take 300 mg by mouth 2 (two) times daily.  ? citalopram (CELEXA) 20 MG tablet Take 20 mg by mouth daily.  ? dextromethorphan-guaiFENesin (MUCINEX DM) 30-600 MG 12hr tablet Take 1 tablet by mouth 2 (two) times daily as needed for cough.  ? fluconazole (DIFLUCAN) 150 MG tablet Take by mouth.  ? fluticasone (FLONASE) 50 MCG/ACT nasal spray Place 2 sprays into both nostrils daily.  ? meclizine (ANTIVERT) 12.5 MG tablet Take 1 tablet (12.5 mg total) by mouth 3 (three) times daily as needed for dizziness (will cause drowsiness).  ? montelukast (SINGULAIR) 10 MG tablet Take 1 tablet (10 mg total) by  mouth at bedtime.  ? Olopatadine HCl 0.2 % SOLN Apply 1 drop to eye daily.  ? predniSONE (DELTASONE) 10 MG tablet Take 1 tablet (10 mg total) by mouth daily with breakfast.  ? Semaglutide,0.25 or 0.'5MG'$ /DOS, (OZEMPIC, 0.25 OR 0.5 MG/DOSE,) 2 MG/1.5ML SOPN Inject 0.5 mg into the skin once a week.  ? [DISCONTINUED] azithromycin (ZITHROMAX) 250 MG tablet Take 2 tablets on day 1, then 1 tablet daily on days 2 through 5 (Patient not taking: Reported on 04/29/2021)  ? [DISCONTINUED] chlorpheniramine-HYDROcodone (TUSSIONEX PENNKINETIC ER) 10-8 MG/5ML Take 5 mLs by mouth at bedtime as needed. (Patient not taking: Reported on 04/29/2021)  ? [DISCONTINUED] methylPREDNISolone (MEDROL DOSEPAK) 4 MG TBPK tablet Use as directed take in the AM with food (Patient not taking: Reported on 04/29/2021)  ? [DISCONTINUED] predniSONE (STERAPRED UNI-PAK 21 TAB) 10 MG (21) TBPK tablet PO: Take 6 tablets on day 1:Take 5 tablets day 2:Take 4 tablets day 3: Take 3 tablets day 4:Take 2 tablets day five: 5 Take 1 tablet day 6 (Patient not taking: Reported on 04/29/2021)  ? [DISCONTINUED] progesterone (PROMETRIUM) 100 MG capsule Take 100 mg by mouth at bedtime. (Patient not taking: Reported on 04/16/2021)  ? ?No facility-administered encounter medications on file as of 04/29/2021.  ? ? ?Allergies as of 04/29/2021 - Review Complete 04/29/2021  ?Allergen Reaction Noted  ? Miralax [polyethylene glycol] Other (See Comments) 04/16/2021  ? ? ?Past Medical History:  ?Diagnosis Date  ? Allergy   ? Anxiety   ?  needs with flying, bloodwork  ? Arthritis   ? Chronic low back pain   ? COVID-19   ? x2  ? HTN (hypertension) 03/28/2016  ? Osteoarthritis of left hip   ? severe determined by x-ray  ? ? ?Past Surgical History:  ?Procedure Laterality Date  ? COSMETIC SURGERY  07/2018  ? tummy tuck and facelift  ? ? ?No family history on file. ? ?Social History  ? ?Socioeconomic History  ? Marital status: Married  ?  Spouse name: Not on file  ? Number of children: Not on  file  ? Years of education: Not on file  ? Highest education level: Not on file  ?Occupational History  ? Occupation: semi retired  ?Tobacco Use  ? Smoking status: Never  ? Smokeless tobacco: Never  ?Vaping Use  ? Vaping Use: Never used  ?Substance and Sexual Activity  ? Alcohol use: No  ?  Alcohol/week: 0.0 standard drinks  ? Drug use: No  ? Sexual activity: Not Currently  ?Other Topics Concern  ? Not on file  ?Social History Narrative  ? Not on file  ? ?Social Determinants of Health  ? ?Financial Resource Strain: Low Risk   ? Difficulty of Paying Living Expenses: Not hard at all  ?Food Insecurity: No Food Insecurity  ? Worried About Charity fundraiser in the Last Year: Never true  ? Ran Out of Food in the Last Year: Never true  ?Transportation Needs: No Transportation Needs  ? Lack of Transportation (Medical): No  ? Lack of Transportation (Non-Medical): No  ?Physical Activity: Inactive  ? Days of Exercise per Week: 0 days  ? Minutes of Exercise per Session: 0 min  ?Stress: No Stress Concern Present  ? Feeling of Stress : Not at all  ?Social Connections: Not on file  ?Intimate Partner Violence: Not on file  ? ? ?Review of Systems  ?Respiratory:  Positive for cough, shortness of breath and wheezing.   ? ?Vitals:  ? 04/29/21 1558  ?BP: 128/72  ?Pulse: 77  ?Temp: 98 ?F (36.7 ?C)  ?SpO2: 97%  ? ? ? ?Physical Exam ?Constitutional:   ?   Appearance: Normal appearance.  ?HENT:  ?   Head: Normocephalic.  ?   Mouth/Throat:  ?   Mouth: Mucous membranes are moist.  ?Cardiovascular:  ?   Rate and Rhythm: Normal rate and regular rhythm.  ?   Heart sounds: No murmur heard. ?  No friction rub.  ?Pulmonary:  ?   Effort: No respiratory distress.  ?   Breath sounds: No stridor. Wheezing and rhonchi present.  ?Musculoskeletal:  ?   Cervical back: No rigidity.  ?Neurological:  ?   General: No focal deficit present.  ?   Mental Status: She is alert.  ?Psychiatric:     ?   Mood and Affect: Mood normal.  ? ?Data Reviewed: ?Recent chest  x-ray was negative according to patient ? ?Assessment:  ?Bronchitis ? ?Airway hyperactivity ? ?Wheezing ? ?Inhaler technique was reviewed and taught ? ?Plan/Recommendations: ?Encouraged to complete course of current antibiotics ?-Complete course of Augmentin and Z-Pak ?I will call in a prescription for prednisone 10 mg daily for about 3 to 4 days, can then go to every other day to make about 7 days in total ? ?Encouraged rescue inhaler use as needed ? ?Mucinex as needed ? ?I do think it may still take a few days for airway inflammation to settle ? ?Encouraged to call with any significant concerns ? ?  Tentative follow-up in about 3 to 4 weeks ? ? ?Sherrilyn Rist MD ?[facility] ?04/29/2021, 8:22 PM ? ?CC: McLean-Scocuzza, Olivia Mackie * ? ? ?

## 2021-05-01 ENCOUNTER — Telehealth: Payer: Self-pay

## 2021-05-01 NOTE — Telephone Encounter (Signed)
Lvm for pt to return call in regards to urine results. ? ?Per Dr.Tracy ?No uti/bladder infection ?

## 2021-05-05 ENCOUNTER — Telehealth: Payer: Self-pay | Admitting: Internal Medicine

## 2021-05-05 NOTE — Addendum Note (Signed)
Addended by: Orland Mustard on: 05/05/2021 01:17 PM ? ? Modules accepted: Orders ? ?

## 2021-05-05 NOTE — Telephone Encounter (Signed)
Per my chart message call and see if pt wants to pick up stool container for parasites if so order in will ned to place stool container at front for pt and she and has to turn into the hospital  ? ?We do have stool testing for parasites if you wish to pick up the container which you would turn back into the hospital let me know and I will order it  ?1)yes I have received her labs [facility]  ?2) inform we still need to repeat labs and do EKG before her scheduled surgery and keep appt to clear her for surgery  ?3) currently I dont have a reason for her elevated White blood cell count but we will repeat it  ? ?My reports to Pts my chart message previously ?You also had recent injections you stated on our first visit so elevated white blood count could be reactive to that  ?  ?We need to repeat labs and do other labs and EKG as apart of the work up prior to hip surgery (left) not just you but this is required for orthopedics paperwork so please do blood work prior to your hip surgery so I may sign off and fax the paperwork back  ?But we also need an office visit with me for a ekg as well  ?  ?Pls let me know  ?

## 2021-05-06 ENCOUNTER — Other Ambulatory Visit (INDEPENDENT_AMBULATORY_CARE_PROVIDER_SITE_OTHER): Payer: Medicare Other

## 2021-05-06 DIAGNOSIS — N3000 Acute cystitis without hematuria: Secondary | ICD-10-CM

## 2021-05-06 DIAGNOSIS — J40 Bronchitis, not specified as acute or chronic: Secondary | ICD-10-CM

## 2021-05-06 DIAGNOSIS — Z01818 Encounter for other preprocedural examination: Secondary | ICD-10-CM

## 2021-05-06 DIAGNOSIS — D72829 Elevated white blood cell count, unspecified: Secondary | ICD-10-CM

## 2021-05-06 LAB — CBC WITH DIFFERENTIAL/PLATELET
Basophils Absolute: 0.1 10*3/uL (ref 0.0–0.1)
Basophils Relative: 0.4 % (ref 0.0–3.0)
Eosinophils Absolute: 0.1 10*3/uL (ref 0.0–0.7)
Eosinophils Relative: 0.6 % (ref 0.0–5.0)
HCT: 42.5 % (ref 36.0–46.0)
Hemoglobin: 14.3 g/dL (ref 12.0–15.0)
Lymphocytes Relative: 38.2 % (ref 12.0–46.0)
Lymphs Abs: 6.2 10*3/uL — ABNORMAL HIGH (ref 0.7–4.0)
MCHC: 33.6 g/dL (ref 30.0–36.0)
MCV: 95.6 fl (ref 78.0–100.0)
Monocytes Absolute: 1 10*3/uL (ref 0.1–1.0)
Monocytes Relative: 6 % (ref 3.0–12.0)
Neutro Abs: 8.9 10*3/uL — ABNORMAL HIGH (ref 1.4–7.7)
Neutrophils Relative %: 54.8 % (ref 43.0–77.0)
Platelets: 279 10*3/uL (ref 150.0–400.0)
RBC: 4.44 Mil/uL (ref 3.87–5.11)
RDW: 13.5 % (ref 11.5–15.5)
WBC: 16.2 10*3/uL — ABNORMAL HIGH (ref 4.0–10.5)

## 2021-05-06 LAB — COMPREHENSIVE METABOLIC PANEL
ALT: 14 U/L (ref 0–35)
AST: 17 U/L (ref 0–37)
Albumin: 4.4 g/dL (ref 3.5–5.2)
Alkaline Phosphatase: 57 U/L (ref 39–117)
BUN: 15 mg/dL (ref 6–23)
CO2: 25 mEq/L (ref 19–32)
Calcium: 9 mg/dL (ref 8.4–10.5)
Chloride: 101 mEq/L (ref 96–112)
Creatinine, Ser: 0.9 mg/dL (ref 0.40–1.20)
GFR: 65.82 mL/min (ref >60.00)
Glucose, Bld: 85 mg/dL (ref 70–99)
Potassium: 3.4 mEq/L — ABNORMAL LOW (ref 3.5–5.1)
Sodium: 138 mEq/L (ref 135–145)
Total Bilirubin: 0.6 mg/dL (ref 0.2–1.2)
Total Protein: 6.9 g/dL (ref 6.0–8.3)

## 2021-05-06 LAB — PROTIME-INR
INR: 0.9 ratio (ref 0.8–1.0)
Prothrombin Time: 10.1 s (ref 9.6–13.1)

## 2021-05-07 ENCOUNTER — Telehealth: Payer: Self-pay

## 2021-05-07 ENCOUNTER — Telehealth: Payer: Self-pay | Admitting: Internal Medicine

## 2021-05-07 NOTE — Addendum Note (Signed)
Addended by: Orland Mustard on: 05/07/2021 04:18 PM ? ? Modules accepted: Orders ? ?

## 2021-05-07 NOTE — Telephone Encounter (Signed)
Lvm for pt to return call in regards to lab results. Pt scheduled for OV 05/08/21. ? ?Per Dr.Tracy: ?Potassium slightly low  ?-mail high potassium food list  ?White blood cell ct increased  ?-due to respiratory issues I am ordering a ct chest  ?Clotting labs for surgery normal  ?

## 2021-05-07 NOTE — Telephone Encounter (Signed)
Lft pt vm to call ofc to sch CT chest. Thank you! ?

## 2021-05-08 ENCOUNTER — Ambulatory Visit (INDEPENDENT_AMBULATORY_CARE_PROVIDER_SITE_OTHER): Payer: Medicare Other | Admitting: Internal Medicine

## 2021-05-08 ENCOUNTER — Encounter: Payer: Self-pay | Admitting: Internal Medicine

## 2021-05-08 VITALS — BP 124/70 | HR 80 | Temp 98.4°F | Resp 14 | Ht 67.0 in | Wt 176.2 lb

## 2021-05-08 DIAGNOSIS — Z01818 Encounter for other preprocedural examination: Secondary | ICD-10-CM

## 2021-05-08 DIAGNOSIS — J4520 Mild intermittent asthma, uncomplicated: Secondary | ICD-10-CM | POA: Diagnosis not present

## 2021-05-08 DIAGNOSIS — R053 Chronic cough: Secondary | ICD-10-CM

## 2021-05-08 DIAGNOSIS — M25511 Pain in right shoulder: Secondary | ICD-10-CM | POA: Diagnosis not present

## 2021-05-08 DIAGNOSIS — M62838 Other muscle spasm: Secondary | ICD-10-CM | POA: Diagnosis not present

## 2021-05-08 DIAGNOSIS — Z13818 Encounter for screening for other digestive system disorders: Secondary | ICD-10-CM | POA: Diagnosis not present

## 2021-05-08 DIAGNOSIS — Z6827 Body mass index (BMI) 27.0-27.9, adult: Secondary | ICD-10-CM | POA: Insufficient documentation

## 2021-05-08 DIAGNOSIS — M542 Cervicalgia: Secondary | ICD-10-CM | POA: Diagnosis not present

## 2021-05-08 DIAGNOSIS — D72829 Elevated white blood cell count, unspecified: Secondary | ICD-10-CM

## 2021-05-08 DIAGNOSIS — D72820 Lymphocytosis (symptomatic): Secondary | ICD-10-CM | POA: Diagnosis not present

## 2021-05-08 DIAGNOSIS — J40 Bronchitis, not specified as acute or chronic: Secondary | ICD-10-CM

## 2021-05-08 MED ORDER — TRELEGY ELLIPTA 100-62.5-25 MCG/ACT IN AEPB: 1.0000 | INHALATION_SPRAY | Freq: Every day | RESPIRATORY_TRACT | 0 refills | Status: DC

## 2021-05-08 MED ORDER — TIZANIDINE HCL 4 MG PO TABS: 4.0000 mg | ORAL_TABLET | Freq: Every day | ORAL | 2 refills | Status: DC

## 2021-05-08 NOTE — Telephone Encounter (Signed)
Pt was seen for Pre-Op clearance on 05/08/21 and discussed with Dr.Tracy her lab results, paperwork for surgical cleareance was filled out and faxed to [facility] to [place] office and pt had EKG done for pt's baseline.  ?

## 2021-05-08 NOTE — Patient Instructions (Signed)
?Consider aspercream with lidocaine or voltaren gel over the counter for joint pains  ?Also tylenol arthritis  ?F/u with [facility] shoulder, knee and hip pain  ? ?Shoulder Range of Motion Exercises ?Shoulder range of motion (ROM) exercises are done to keep the shoulder moving freely or to increase movement. They are often recommended for people who have shoulder pain or stiffness or who are recovering from a shoulder surgery. ?Phase 1 exercise ?When you are able, do this exercise 1-2 times per day for 30-60 seconds in each direction, or as directed by your health care provider. ?Pendulum exercise ? ?  ? ?To do this exercise while sitting: ?Sit in a chair or at the edge of your bed with your feet flat on the floor. ?Let your affected arm hang down in front of you over the edge of the bed or chair. ?Relax your shoulder, arm, and hand. ?Rock your body so your arm gently swings in small circles. You can also use your unaffected arm to start the motion. ?Repeat changing the direction of the circles, swinging your arm left and right, and swinging your arm forward and back. ?To do this exercise while standing: ?Stand next to a sturdy chair or table, and hold on to it with your hand on your unaffected side. ?Bend forward at the waist. ?Bend your knees slightly. ?Relax your shoulder, arm, and hand. ?While keeping your shoulder relaxed, use body motion to swing your arm in small circles. ?Repeat changing the direction of the circles, swinging your arm left and right, and swinging your arm forward and back. ?Between exercises, stand up tall and take a short break to relax your lower back. ? ?Phase 2 exercises ?Do these exercises 1-2 times per day or as told by your health care provider. Hold each stretch for 30 seconds, and repeat 3 times. Do the exercises with one or both arms as instructed by your health care provider. ?For these exercises, sit at a table with your hand and arm supported by the table. A chair that slides  easily or has wheels can be helpful. ?External rotation ? ?Turn your chair so that your affected side is nearest to the table. ?Place your forearm on the table to your side. Bend your elbow in about a 90-degree angle (right angle) at the elbow, and place your hand palm-down on the table. Your elbow should be about 6 inches (15 cm) away from your side. ?Keeping your arm on the table, lean your body forward. ?Abduction ? ?Turn your chair so that your affected side is nearest to the table. ?Place your forearm and hand on the table so that your thumb points toward the ceiling and your arm is straight out to your side. ?Slide your hand out to the side and away from you, using your unaffected arm to do the work. ?To increase the stretch, you can slide your chair away from the table. ?Flexion: forward stretch ? ?Sit facing the table. Place your hand and elbow on the table in front of you. ?Slide your hand forward and away from you, using your unaffected arm to do the work. ?To increase the stretch, you can slide your chair backward. ?Phase 3 exercises ?Do these exercises 1-2 times per day or as told by your health care provider. Hold each stretch for 30 seconds, and repeat 3 times. Do the exercises with one or both arms as instructed by your health care provider. ?You will need a cane, a piece of PVC pipe, or  a sturdy wooden dowel for the wand exercises. ?Cross-body stretch: posterior capsule stretch ? ?Lift your arm straight out in front of you. ?Bend your arm in a 90-degree angle (right angle) at the elbow so your forearm moves across your body. ?Use your other arm to gently pull the elbow across your body, toward your other shoulder. ?Wall climbs ? ?Stand with your affected arm extended out to the side with your hand resting on a door frame. ?Slide your hand slowly up the door frame. ?To increase the stretch, step through the door frame. Keep your body upright and do not lean. ?Flexion ? ?  ? ?To do this exercise while  standing: ?Hold the wand with both of your hands, palms down. ?Using the other arm to help, lift your arms up and over your head, if able. ?Push upward with your other arm to gently increase the stretch. ?To do this exercise while lying down: ?Lie on your back with your elbows resting on the floor and the wand in both your hands. Your hands will be palm down, or pointing toward your feet. ?Lift your hands toward the ceiling, using your unaffected arm to help if needed. ?Bring your arms overhead as able, using your unaffected arm to help if needed. ? ?Internal rotation ? ?Stand while holding the wand behind you with both hands. Your unaffected arm should be extended above your head with the arm of the affected side extended behind you at the level of your waist. The wand should be pointing straight up and down as you hold it. ?Slowly pull the wand up behind your back by straightening the elbow of your unaffected arm and bending the elbow of your affected arm. ?External rotation ? ?Lie on your back with your affected upper arm supported on a small pillow or rolled towel. When you first do this exercise, keep your upper arm close to your body. Over time, bring your arm up to a 90-degree angle (right angle) out to the side. ?Hold the wand across your stomach and with both hands palm up. Your elbow on your affected side should be bent at a 90-degree angle. ?Use your unaffected side to help push your forearm away from you and toward the floor. Keep your elbow on your affected side bent at a 90-degree angle. ?Contact a health care provider if you have: ?New or increasing pain. ?New numbness, tingling, weakness, or discoloration in your arm or hand. ?This information is not intended to replace advice given to you by your health care provider. Make sure you discuss any questions you have with your health care provider. ?Document Revised: 12/24/2020 Document Reviewed: 09/13/2020 ?Elsevier Patient Education ? [place]. ? ?Shoulder Exercises ?Ask your health care provider which exercises are safe for you. Do exercises exactly as told by your health care provider and adjust them as directed. It is normal to feel mild stretching, pulling, tightness, or discomfort as you do these exercises. Stop right away if you feel sudden pain or your pain gets worse. Do not begin these exercises until told by your health care provider. ?Stretching exercises ?External rotation and abduction ?This exercise is sometimes called corner stretch. This exercise rotates your arm outward (external rotation) and moves your arm out from your body (abduction). ?Stand in a doorway with one of your feet slightly in front of the other. This is called a staggered stance. If you cannot reach your forearms to the door frame, stand facing a corner of a room. ?  Choose one of the following positions as told by your health care provider: ?Place your hands and forearms on the door frame above your head. ?Place your hands and forearms on the door frame at the height of your head. ?Place your hands on the door frame at the height of your elbows. ?Slowly move your weight onto your front foot until you feel a stretch across your chest and in the front of your shoulders. Keep your head and chest upright and keep your abdominal muscles tight. ?Hold for __________ seconds. ?To release the stretch, shift your weight to your back foot. ?Repeat __________ times. Complete this exercise __________ times a day. ?Extension, standing ?Stand and hold a broomstick, a cane, or a similar object behind your back. ?Your hands should be a little wider than shoulder width apart. ?Your palms should face away from your back. ?Keeping your elbows straight and your shoulder muscles relaxed, move the stick away from your body until you feel a stretch in your shoulders (extension). ?Avoid shrugging your shoulders while you move the stick. Keep your shoulder blades tucked down toward the middle  of your back. ?Hold for __________ seconds. ?Slowly return to the starting position. ?Repeat __________ times. Complete this exercise __________ times a day. ?Range-of-motion exercises ?Pendulum ? ?Stand

## 2021-05-08 NOTE — Progress Notes (Addendum)
Chief Complaint  ?Patient presents with  ? Pre-op Exam  ?  L total hip replacement, discuss labs that were drawn 05/06/21, and EKG for baseline. Ortho stated that hip was causing her knee pain. R shoulder swollen concerned about her lymph nodes.   ? ?F/u for pre-op for Dr. Alferd Apa [facility] will be done [facility] as sch 05/28/21 but with elevated wbc moved back left total hip surgery ?She was having left knee pain too which ortho thinks is caused by hip pain  ?Right shoulder c/o swollen  ? ?Wbc 04/22/21 was 15.5 and >16 checked by my office  ? ?Ekg today pre-op clearance  ? ? ?Review of Systems  ?Constitutional:  Negative for weight loss.  ?HENT:  Negative for hearing loss.   ?Eyes:  Negative for blurred vision.  ?Respiratory:  Negative for shortness of breath.   ?Cardiovascular:  Negative for chest pain.  ?Gastrointestinal:  Negative for abdominal pain and blood in stool.  ?Genitourinary:  Negative for dysuria.  ?Musculoskeletal:  Positive for joint pain. Negative for falls.  ?Skin:  Negative for rash.  ?Neurological:  Negative for headaches.  ?Psychiatric/Behavioral:  Negative for depression.   ?Past Medical History:  ?Diagnosis Date  ? Allergy   ? Anxiety   ? needs with flying, bloodwork  ? Arthritis   ? Chronic low back pain   ? COVID-19   ? x2  ? HTN (hypertension) 03/28/2016  ? Osteoarthritis of left hip   ? severe determined by x-ray  ? ?Past Surgical History:  ?Procedure Laterality Date  ? COSMETIC SURGERY  07/2018  ? tummy tuck and facelift  ? ?No family history on file. ?Social History  ? ?Socioeconomic History  ? Marital status: Married  ?  Spouse name: Not on file  ? Number of children: Not on file  ? Years of education: Not on file  ? Highest education level: Not on file  ?Occupational History  ? Occupation: semi retired  ?Tobacco Use  ? Smoking status: Never  ? Smokeless tobacco: Never  ?Vaping Use  ? Vaping Use: Never used  ?Substance and Sexual Activity  ? Alcohol use: No  ?   Alcohol/week: 0.0 standard drinks  ? Drug use: No  ? Sexual activity: Not Currently  ?Other Topics Concern  ? Not on file  ?Social History Narrative  ? Not on file  ? ?Social Determinants of Health  ? ?Financial Resource Strain: Low Risk   ? Difficulty of Paying Living Expenses: Not hard at all  ?Food Insecurity: No Food Insecurity  ? Worried About Charity fundraiser in the Last Year: Never true  ? Ran Out of Food in the Last Year: Never true  ?Transportation Needs: No Transportation Needs  ? Lack of Transportation (Medical): No  ? Lack of Transportation (Non-Medical): No  ?Physical Activity: Inactive  ? Days of Exercise per Week: 0 days  ? Minutes of Exercise per Session: 0 min  ?Stress: No Stress Concern Present  ? Feeling of Stress : Not at all  ?Social Connections: Not on file  ?Intimate Partner Violence: Not on file  ? ?Current Meds  ?Medication Sig  ? albuterol (PROAIR HFA) 108 (90 Base) MCG/ACT inhaler Inhale 1-2 puffs into the lungs every 6 (six) hours as needed for wheezing or shortness of breath.  ? ALPRAZolam (XANAX) 0.5 MG tablet Take 1 tablet (0.5 mg total) by mouth daily as needed for up to 1 dose for anxiety. For flight anxiety.  ?  dextromethorphan-guaiFENesin (MUCINEX DM) 30-600 MG 12hr tablet Take 1 tablet by mouth 2 (two) times daily as needed for cough.  ? fluticasone (FLONASE) 50 MCG/ACT nasal spray Place 2 sprays into both nostrils daily.  ? montelukast (SINGULAIR) 10 MG tablet Take 1 tablet (10 mg total) by mouth at bedtime.  ? Semaglutide,0.25 or 0.'5MG'$ /DOS, (OZEMPIC, 0.25 OR 0.5 MG/DOSE,) 2 MG/1.5ML SOPN Inject 0.5 mg into the skin once a week.  ? ?Allergies  ?Allergen Reactions  ? Miralax [Polyethylene Glycol] Other (See Comments)  ?  ?reaction stomach cramp per patient.  ? ?  ? ?Recent Results (from the past 2160 hour(s))  ?COVID-19, Flu A+B and RSV     Status: None  ? Collection Time: 02/13/21  3:07 PM  ? Specimen: Nasal Swab  ? Nasal Swab  Previously  ?Result Value Ref Range  ?  SARS-CoV-2, NAA Not Detected Not Detected  ? Influenza A, NAA Not Detected Not Detected  ? Influenza B, NAA Not Detected Not Detected  ? RSV, NAA Not Detected Not Detected  ? Test Information: Comment   ?  Comment: This nucleic acid amplification test was developed and its performance ?characteristics determined by [employer]. Nucleic acid ?amplification tests include RT-PCR and TMA. This test has not been ?FDA cleared or approved. This test has been authorized by FDA under ?an Emergency Use Authorization (EUA). This test is only authorized ?for the duration of time the declaration that circumstances exist ?justifying the authorization of the emergency use of in vitro ?diagnostic tests for detection of SARS-CoV-2 virus and/or diagnosis ?of COVID-19 infection under section 564(b)(1) of the Act, 21 U.S.C. ?360bbb-3(b) (1), unless the authorization is terminated or revoked ?sooner. ?When diagnostic testing is negative, the possibility of a false ?negative result should be considered in the context of a patient's ?recent exposures and the presence of clinical signs and symptoms ?consistent with COVID-19. An individual without symptoms of COVID-19 ?and who is not shedding SARS-CoV-2 virus wo uld expect to have a ?negative (not detected) result in this assay. ?  ?CBC with Differential/Platelet     Status: Abnormal  ? Collection Time: 02/13/21  3:07 PM  ?Result Value Ref Range  ? WBC 9.7 4.0 - 10.5 K/uL  ? RBC 4.27 3.87 - 5.11 Mil/uL  ? Hemoglobin 13.9 12.0 - 15.0 g/dL  ? HCT 40.5 36.0 - 46.0 %  ? MCV 94.8 78.0 - 100.0 fl  ? MCHC 34.3 30.0 - 36.0 g/dL  ? RDW 13.6 11.5 - 15.5 %  ? Platelets 245.0 150.0 - 400.0 K/uL  ? Neutrophils Relative % 47.7 43.0 - 77.0 %  ? Lymphocytes Relative 43.6 12.0 - 46.0 %  ? Monocytes Relative 6.8 3.0 - 12.0 %  ? Eosinophils Relative 0.5 0.0 - 5.0 %  ? Basophils Relative 1.4 0.0 - 3.0 %  ? Neutro Abs 4.6 1.4 - 7.7 K/uL  ? Lymphs Abs 4.2 (H) 0.7 - 4.0 K/uL  ? Monocytes Absolute 0.7 0.1  - 1.0 K/uL  ? Eosinophils Absolute 0.1 0.0 - 0.7 K/uL  ? Basophils Absolute 0.1 0.0 - 0.1 K/uL  ?Comprehensive metabolic panel     Status: None  ? Collection Time: 02/13/21  3:07 PM  ?Result Value Ref Range  ? Sodium 138 135 - 145 mEq/L  ? Potassium 3.9 3.5 - 5.1 mEq/L  ? Chloride 101 96 - 112 mEq/L  ? CO2 29 19 - 32 mEq/L  ? Glucose, Bld 90 70 - 99 mg/dL  ? BUN 11 6 -  23 mg/dL  ? Creatinine, Ser 0.91 0.40 - 1.20 mg/dL  ? Total Bilirubin 0.6 0.2 - 1.2 mg/dL  ? Alkaline Phosphatase 50 39 - 117 U/L  ? AST 21 0 - 37 U/L  ? ALT 19 0 - 35 U/L  ? Total Protein 6.6 6.0 - 8.3 g/dL  ? Albumin 4.7 3.5 - 5.2 g/dL  ? GFR 65.05 >60.00 mL/min  ?  Comment: Calculated using the CKD-EPI Creatinine Equation (2021)  ? Calcium 9.6 8.4 - 10.5 mg/dL  ?Urine Culture     Status: None  ? Collection Time: 04/28/21 11:03 AM  ? Specimen: Urine  ?Result Value Ref Range  ? MICRO NUMBER: [id number]   ? SPECIMEN QUALITY: Adequate   ? Sample Source NOT GIVEN   ? STATUS: FINAL   ? Result: No Growth   ?Urinalysis, Routine w reflex microscopic     Status: Abnormal  ? Collection Time: 04/28/21 11:03 AM  ?Result Value Ref Range  ? Color, Urine DARK YELLOW YELLOW  ? APPearance CLEAR CLEAR  ? Specific Gravity, Urine 1.023 1.001 - 1.035  ? pH < OR = 5.0 5.0 - 8.0  ? Glucose, UA NEGATIVE NEGATIVE  ? Bilirubin Urine NEGATIVE NEGATIVE  ? Ketones, ur TRACE (A) NEGATIVE  ? Hgb urine dipstick NEGATIVE NEGATIVE  ? Protein, ur TRACE (A) NEGATIVE  ? Nitrite NEGATIVE NEGATIVE  ? Leukocytes,Ua 2+ (A) NEGATIVE  ? WBC, UA 20-40 (A) 0 - 5 /HPF  ? RBC / HPF 3-10 (A) 0 - 2 /HPF  ? Squamous Epithelial / LPF 10-20 (A) < OR = 5 /HPF  ? Bacteria, UA FEW (A) NONE SEEN /HPF  ? Calcium Oxalate Crystal FEW NONE OR FEW /HPF  ? Hyaline Cast 6-10 (A) NONE SEEN /LPF  ?MICROSCOPIC MESSAGE     Status: None  ? Collection Time: 04/28/21 11:03 AM  ?Result Value Ref Range  ? Note    ?  Comment: This urine was analyzed for the presence of WBC,  ?RBC, bacteria, casts, and other formed  elements.  ?Only those elements seen were reported. ?. ?. ?  ?Protime-INR     Status: None  ? Collection Time: 05/06/21 10:48 AM  ?Result Value Ref Range  ? INR 0.9 0.8 - 1.0 ratio  ? Prothrombin Time 10.1 9.6 - 13.1 sec  ?

## 2021-05-08 NOTE — Addendum Note (Signed)
Addended by: Orland Mustard on: 05/08/2021 11:00 AM ? ? Modules accepted: Orders ? ?

## 2021-05-09 ENCOUNTER — Encounter: Payer: Self-pay | Admitting: Internal Medicine

## 2021-05-09 ENCOUNTER — Other Ambulatory Visit: Payer: Self-pay | Admitting: Internal Medicine

## 2021-05-09 DIAGNOSIS — M25511 Pain in right shoulder: Secondary | ICD-10-CM

## 2021-05-09 DIAGNOSIS — M62838 Other muscle spasm: Secondary | ICD-10-CM

## 2021-05-09 DIAGNOSIS — M542 Cervicalgia: Secondary | ICD-10-CM

## 2021-05-09 MED ORDER — CYCLOBENZAPRINE HCL 5 MG PO TABS: 5.0000 mg | ORAL_TABLET | Freq: Every evening | ORAL | 5 refills | Status: DC | PRN

## 2021-05-09 NOTE — Addendum Note (Signed)
Addended by: Orland Mustard on: 05/09/2021 02:54 PM ? ? Modules accepted: Orders ? ?

## 2021-05-12 ENCOUNTER — Encounter: Payer: Self-pay | Admitting: Internal Medicine

## 2021-05-12 NOTE — Telephone Encounter (Signed)
LMTCB to see if patient all medications listed.  ?

## 2021-05-13 ENCOUNTER — Ambulatory Visit: Payer: Medicare Other | Admitting: Internal Medicine

## 2021-05-13 ENCOUNTER — Telehealth: Payer: Self-pay | Admitting: Internal Medicine

## 2021-05-13 NOTE — Telephone Encounter (Signed)
Lft pt vm to call ofc to sch CT. Thank you! ?

## 2021-05-15 ENCOUNTER — Ambulatory Visit: Payer: Medicare Other | Admitting: Pulmonary Disease

## 2021-05-15 ENCOUNTER — Ambulatory Visit
Admission: RE | Admit: 2021-05-15 | Discharge: 2021-05-15 | Disposition: A | Discharge status: Home / Self Care | Payer: Medicare Other | Source: Ambulatory Visit | Attending: Internal Medicine | Admitting: Internal Medicine

## 2021-05-15 ENCOUNTER — Other Ambulatory Visit: Payer: Self-pay

## 2021-05-15 DIAGNOSIS — D72829 Elevated white blood cell count, unspecified: Secondary | ICD-10-CM | POA: Diagnosis not present

## 2021-05-15 DIAGNOSIS — R918 Other nonspecific abnormal finding of lung field: Secondary | ICD-10-CM | POA: Diagnosis not present

## 2021-05-15 DIAGNOSIS — J40 Bronchitis, not specified as acute or chronic: Secondary | ICD-10-CM | POA: Diagnosis not present

## 2021-05-15 DIAGNOSIS — R053 Chronic cough: Secondary | ICD-10-CM | POA: Insufficient documentation

## 2021-05-15 DIAGNOSIS — R911 Solitary pulmonary nodule: Secondary | ICD-10-CM | POA: Diagnosis not present

## 2021-05-16 ENCOUNTER — Ambulatory Visit
Admission: RE | Admit: 2021-05-16 | Discharge: 2021-05-16 | Disposition: A | Discharge status: Home / Self Care | Payer: Medicare Other | Source: Ambulatory Visit | Attending: Internal Medicine | Admitting: Internal Medicine

## 2021-05-16 ENCOUNTER — Encounter: Payer: Self-pay | Admitting: Internal Medicine

## 2021-05-16 DIAGNOSIS — R911 Solitary pulmonary nodule: Secondary | ICD-10-CM | POA: Insufficient documentation

## 2021-05-16 DIAGNOSIS — E2839 Other primary ovarian failure: Secondary | ICD-10-CM

## 2021-05-16 DIAGNOSIS — M8589 Other specified disorders of bone density and structure, multiple sites: Secondary | ICD-10-CM | POA: Diagnosis not present

## 2021-05-16 DIAGNOSIS — Z1231 Encounter for screening mammogram for malignant neoplasm of breast: Secondary | ICD-10-CM

## 2021-05-16 DIAGNOSIS — Z780 Asymptomatic menopausal state: Secondary | ICD-10-CM | POA: Diagnosis not present

## 2021-05-16 NOTE — Telephone Encounter (Signed)
S/w pt in regards to CT results. ?Pt verbalized understanding no further questions asked. ?

## 2021-06-02 ENCOUNTER — Encounter: Payer: Self-pay | Admitting: Internal Medicine

## 2021-06-02 MED ORDER — TRELEGY ELLIPTA 100-62.5-25 MCG/ACT IN AEPB: 1.0000 | INHALATION_SPRAY | Freq: Every day | RESPIRATORY_TRACT | 11 refills | Status: DC

## 2021-06-02 NOTE — Addendum Note (Signed)
Addended by: Orland Mustard on: 06/02/2021 03:01 PM   Modules accepted: Orders

## 2021-06-02 NOTE — Telephone Encounter (Signed)
We dont have the dose needed  Also advise pt to call pulmonary and make appointment she likely needs pulmonary function testing to make sure she does not have asthma/copd Sent trelegy 100 to her pharmacy

## 2021-06-02 NOTE — Telephone Encounter (Signed)
We only give out samples for new starts off the medication  She should have a prescription that she can pick up and if not covered by insurance, we can look into alternatives

## 2021-06-08 ENCOUNTER — Encounter: Payer: Self-pay | Admitting: Internal Medicine

## 2021-06-09 ENCOUNTER — Other Ambulatory Visit: Payer: Self-pay | Admitting: Internal Medicine

## 2021-06-10 ENCOUNTER — Other Ambulatory Visit: Payer: Self-pay | Admitting: Internal Medicine

## 2021-06-10 ENCOUNTER — Other Ambulatory Visit (INDEPENDENT_AMBULATORY_CARE_PROVIDER_SITE_OTHER): Payer: Medicare Other

## 2021-06-10 ENCOUNTER — Other Ambulatory Visit (HOSPITAL_COMMUNITY): Payer: Self-pay

## 2021-06-10 DIAGNOSIS — N3000 Acute cystitis without hematuria: Secondary | ICD-10-CM

## 2021-06-10 DIAGNOSIS — Z13818 Encounter for screening for other digestive system disorders: Secondary | ICD-10-CM | POA: Diagnosis not present

## 2021-06-10 DIAGNOSIS — D72829 Elevated white blood cell count, unspecified: Secondary | ICD-10-CM

## 2021-06-11 DIAGNOSIS — H43391 Other vitreous opacities, right eye: Secondary | ICD-10-CM | POA: Diagnosis not present

## 2021-06-11 DIAGNOSIS — H2512 Age-related nuclear cataract, left eye: Secondary | ICD-10-CM | POA: Diagnosis not present

## 2021-06-11 DIAGNOSIS — Z9889 Other specified postprocedural states: Secondary | ICD-10-CM | POA: Diagnosis not present

## 2021-06-11 LAB — CBC WITH DIFFERENTIAL/PLATELET
Absolute Monocytes: 680 cells/uL (ref 200–950)
Basophils Absolute: 60 cells/uL (ref 0–200)
Basophils Relative: 0.6 %
Eosinophils Absolute: 100 cells/uL (ref 15–500)
Eosinophils Relative: 1 %
HCT: 41.6 % (ref 35.0–45.0)
Hemoglobin: 14.6 g/dL (ref 11.7–15.5)
Lymphs Abs: 4150 cells/uL — ABNORMAL HIGH (ref 850–3900)
MCH: 33.1 pg — ABNORMAL HIGH (ref 27.0–33.0)
MCHC: 35.1 g/dL (ref 32.0–36.0)
MCV: 94.3 fL (ref 80.0–100.0)
MPV: 11.5 fL (ref 7.5–12.5)
Monocytes Relative: 6.8 %
Neutro Abs: 5010 cells/uL (ref 1500–7800)
Neutrophils Relative %: 50.1 %
Platelets: 252 10*3/uL (ref 140–400)
RBC: 4.41 10*6/uL (ref 3.80–5.10)
RDW: 13.9 % (ref 11.0–15.0)
Total Lymphocyte: 41.5 %
WBC: 10 10*3/uL (ref 3.8–10.8)

## 2021-06-11 LAB — HEPATITIS C ANTIBODY
Hepatitis C Ab: NONREACTIVE
SIGNAL TO CUT-OFF: 0.09 (ref <1.00)

## 2021-06-11 LAB — PATHOLOGIST SMEAR REVIEW

## 2021-06-11 NOTE — Telephone Encounter (Signed)
LMTCB to see if patient requested I see that she left urine specimen yesterday.

## 2021-06-12 ENCOUNTER — Encounter: Payer: Self-pay | Admitting: Internal Medicine

## 2021-06-16 ENCOUNTER — Encounter: Payer: Self-pay | Admitting: Internal Medicine

## 2021-06-17 ENCOUNTER — Telehealth: Payer: Self-pay

## 2021-06-17 ENCOUNTER — Encounter: Payer: Self-pay | Admitting: Internal Medicine

## 2021-06-17 NOTE — Telephone Encounter (Signed)
Pt returning call

## 2021-06-17 NOTE — Telephone Encounter (Signed)
Lvm for pt to return call in regards to lab results  Per Dr.Tracy: White blood cell count normal but lymphocytes elevated in the blood  Does she want to see the blood specialist/hematology to work this up?   -->faxed results to [facility] Dr. Alferd Apa  Hepatitis c normal/negative

## 2021-06-19 NOTE — Addendum Note (Signed)
Addended by: Orland Mustard on: 06/19/2021 12:44 PM   Modules accepted: Orders

## 2021-06-30 ENCOUNTER — Telehealth: Payer: Self-pay | Admitting: Internal Medicine

## 2021-06-30 NOTE — Telephone Encounter (Signed)
Copied from [place] [id number]. Topic: Medicare AWV >> Jun 30, 2021 10:13 AM Devoria Glassing wrote: Reason for CRM: Left message for patient to schedule Annual Wellness Visit.  Please schedule with Nurse Health Advisor Denisa O'Brien-Blaney, LPN at [facility].  Please call [phone] ask for [facility]

## 2021-07-01 ENCOUNTER — Inpatient Hospital Stay: Payer: Medicare Other | Attending: Oncology | Admitting: Oncology

## 2021-07-01 ENCOUNTER — Encounter: Payer: Self-pay | Admitting: Oncology

## 2021-07-01 ENCOUNTER — Encounter: Payer: Self-pay | Admitting: Internal Medicine

## 2021-07-01 ENCOUNTER — Inpatient Hospital Stay: Payer: Medicare Other

## 2021-07-01 ENCOUNTER — Other Ambulatory Visit: Payer: Self-pay

## 2021-07-01 VITALS — BP 98/62 | HR 82 | Resp 16 | Ht 66.0 in | Wt 172.0 lb

## 2021-07-01 DIAGNOSIS — I10 Essential (primary) hypertension: Secondary | ICD-10-CM | POA: Insufficient documentation

## 2021-07-01 DIAGNOSIS — Z79899 Other long term (current) drug therapy: Secondary | ICD-10-CM | POA: Diagnosis not present

## 2021-07-01 DIAGNOSIS — E119 Type 2 diabetes mellitus without complications: Secondary | ICD-10-CM | POA: Diagnosis not present

## 2021-07-01 DIAGNOSIS — F419 Anxiety disorder, unspecified: Secondary | ICD-10-CM | POA: Insufficient documentation

## 2021-07-01 DIAGNOSIS — Z7952 Long term (current) use of systemic steroids: Secondary | ICD-10-CM | POA: Insufficient documentation

## 2021-07-01 DIAGNOSIS — D72820 Lymphocytosis (symptomatic): Secondary | ICD-10-CM | POA: Diagnosis not present

## 2021-07-01 LAB — CBC WITH DIFFERENTIAL/PLATELET
Abs Immature Granulocytes: 0.02 10*3/uL (ref 0.00–0.07)
Basophils Absolute: 0 10*3/uL (ref 0.0–0.1)
Basophils Relative: 0 %
Eosinophils Absolute: 0.1 10*3/uL (ref 0.0–0.5)
Eosinophils Relative: 1 %
HCT: 40 % (ref 36.0–46.0)
Hemoglobin: 13.8 g/dL (ref 12.0–15.0)
Immature Granulocytes: 0 %
Lymphocytes Relative: 41 %
Lymphs Abs: 3.8 10*3/uL (ref 0.7–4.0)
MCH: 32.3 pg (ref 26.0–34.0)
MCHC: 34.5 g/dL (ref 30.0–36.0)
MCV: 93.7 fL (ref 80.0–100.0)
Monocytes Absolute: 0.7 10*3/uL (ref 0.1–1.0)
Monocytes Relative: 8 %
Neutro Abs: 4.5 10*3/uL (ref 1.7–7.7)
Neutrophils Relative %: 50 %
Platelets: 261 10*3/uL (ref 150–400)
RBC: 4.27 MIL/uL (ref 3.87–5.11)
RDW: 13.1 % (ref 11.5–15.5)
WBC: 9.1 10*3/uL (ref 4.0–10.5)
nRBC: 0 % (ref 0.0–0.2)

## 2021-07-01 NOTE — Progress Notes (Signed)
Pt does not feel like she has energy, she had been on prednisone for 3 months for a productive cough, when she got put on trellegy the cough got better but she urinated frequently with dysuria. She stopped the med

## 2021-07-01 NOTE — Progress Notes (Signed)
Hematology/Oncology Consult note [facility] Telephone:([phone] [phone]  Patient Care Team: McLean-Scocuzza, Nino Glow, MD as PCP - General (Internal Medicine)   Name of the patient: Charlene Cox  [id number]  1955-05-16    Reason for referral-lymphocytosis   Referring physician- Dr. Terese Door   Date of visit: 07/01/21   History of presenting illness-patient is a 66 year old female with a past medical history significant for Hypertension, type 2 diabetes, anxiety referred for lymphocytosis.  Most recent CBC from 06/10/2021 showed a white cell count of 10, H&H of 14.6/41.6 and a platelet count of 252.  There is mild lymphocytosis with an absolute lymphocyte count of 4.1.  Looking back at her prior CBCs her white counts have been normal around 9.5 with a normal hemoglobin and platelet count with an absolute lymphocyte count which has been between 4-6 without a clear rising trend.  Patient reports doing well overall.  Denies any fatigue, unintentional weight loss, drenching night sweats.  Denies any lumps or bumps anywhere  ECOG PS- 1  Pain scale- 0   Review of systems- Review of Systems  Constitutional:  Negative for chills, fever, malaise/fatigue and weight loss.  HENT:  Negative for congestion, ear discharge and nosebleeds.   Eyes:  Negative for blurred vision.  Respiratory:  Negative for cough, hemoptysis, sputum production, shortness of breath and wheezing.   Cardiovascular:  Negative for chest pain, palpitations, orthopnea and claudication.  Gastrointestinal:  Negative for abdominal pain, blood in stool, constipation, diarrhea, heartburn, melena, nausea and vomiting.  Genitourinary:  Negative for dysuria, flank pain, frequency, hematuria and urgency.  Musculoskeletal:  Negative for back pain, joint pain and myalgias.  Skin:  Negative for rash.  Neurological:  Negative for dizziness, tingling, focal weakness, seizures, weakness and  headaches.  Endo/Heme/Allergies:  Does not bruise/bleed easily.  Psychiatric/Behavioral:  Negative for depression and suicidal ideas. The patient does not have insomnia.     Allergies  Allergen Reactions   Zanaflex [Tizanidine]     ...after 1 pill of that muscle relaxer about 4 hours later when I got up to pee there was a discomfort it seemed with my urinary track almost like a serious UTI.  I took Tylenol and it began to subside...it had to be my reaction to the muscle relaxer..thanks so much. Teresa   Miralax [Polyethylene Glycol] Other (See Comments)    ?reaction stomach cramp per patient.       Patient Active Problem List   Diagnosis Date Noted   Lung nodule 05/16/2021   BMI 27.0-27.9,adult 05/08/2021   Hyperlipidemia 04/16/2021   Prediabetes 02/12/2021   Eustachian tube dysfunction, bilateral 02/12/2021   BMI 28.0-28.9,adult 01/22/2021   History of bronchitis 01/19/2021   Chest congestion 01/19/2021   Acute non-recurrent maxillary sinusitis 01/19/2021   Vitamin D deficiency 01/19/2021   Vitamin B12 deficiency 01/19/2021   Encounter to establish care 01/19/2021   On postmenopausal hormone replacement therapy 12/24/2016   Elevated blood pressure reading in office with diagnosis of hypertension 12/24/2016   Hypertension 03/28/2016   Anxiety 03/28/2016     Past Medical History:  Diagnosis Date   Allergy    Anxiety    needs with flying, bloodwork   Arthritis    Chronic low back pain    COVID-19    x2   HTN (hypertension) 03/28/2016   Osteoarthritis of left hip    severe determined by x-ray     Past Surgical History:  Procedure Laterality Date   COSMETIC SURGERY  07/2018   tummy tuck and facelift    Social History   Socioeconomic History   Marital status: Married    Spouse name: Not on file   Number of children: Not on file   Years of education: Not on file   Highest education level: Not on file  Occupational History   Occupation: semi retired   Tobacco Use   Smoking status: Never   Smokeless tobacco: Never  Vaping Use   Vaping Use: Never used  Substance and Sexual Activity   Alcohol use: No    Alcohol/week: 0.0 standard drinks of alcohol   Drug use: No   Sexual activity: Not Currently  Other Topics Concern   Not on file  Social History Narrative   Not on file   Social Determinants of Health   Financial Resource Strain: Low Risk  (06/27/2020)   Overall Financial Resource Strain (CARDIA)    Difficulty of Paying Living Expenses: Not hard at all  Food Insecurity: No Food Insecurity (06/27/2020)   Hunger Vital Sign    Worried About Running Out of Food in the Last Year: Never true    [place] in the Last Year: Never true  Transportation Needs: No Transportation Needs (06/27/2020)   PRAPARE - Hydrologist (Medical): No    Lack of Transportation (Non-Medical): No  Physical Activity: Inactive (06/27/2020)   Exercise Vital Sign    Days of Exercise per Week: 0 days    Minutes of Exercise per Session: 0 min  Stress: No Stress Concern Present (06/27/2020)   [place]    Feeling of Stress : Not at all  Social Connections: Not on file  Intimate Partner Violence: Not on file     No family history on file.   Current Outpatient Medications:    albuterol (PROAIR HFA) 108 (90 Base) MCG/ACT inhaler, Inhale 1-2 puffs into the lungs every 6 (six) hours as needed for wheezing or shortness of breath., Disp: 18 g, Rfl: 11   ALPRAZolam (XANAX) 0.5 MG tablet, Take 1 tablet (0.5 mg total) by mouth daily as needed for up to 1 dose for anxiety. For flight anxiety., Disp: 30 tablet, Rfl: 0   citalopram (CELEXA) 20 MG tablet, Take 20 mg by mouth daily. (Patient not taking: Reported on 05/08/2021), Disp: , Rfl:    cyclobenzaprine (FLEXERIL) 5 MG tablet, TAKE 1 TABLET BY MOUTH AT BEDTIME AS NEEDED FOR MUSCLE SPASMS., Disp: 30 tablet, Rfl: 5    dextromethorphan-guaiFENesin (MUCINEX DM) 30-600 MG 12hr tablet, Take 1 tablet by mouth 2 (two) times daily as needed for cough., Disp: 60 tablet, Rfl: 0   fluconazole (DIFLUCAN) 150 MG tablet, Take by mouth. (Patient not taking: Reported on 05/08/2021), Disp: , Rfl:    fluticasone (FLONASE) 50 MCG/ACT nasal spray, Place 2 sprays into both nostrils daily., Disp: 16 g, Rfl: 6   Fluticasone-Umeclidin-Vilant (TRELEGY ELLIPTA) 100-62.5-25 MCG/ACT AEPB, Inhale 1 puff into the lungs daily. Rinse mouth Samples of this drug were given to the patient, quantity 2, Lot Number (10) [id number] exp 07/2022: lot ([id number]) [id number] exp 09/2022, Disp: 2 each, Rfl: 0   Fluticasone-Umeclidin-Vilant (TRELEGY ELLIPTA) 100-62.5-25 MCG/ACT AEPB, Inhale 1 puff into the lungs daily. Rinse mouth, Disp: 1 each, Rfl: 11   meclizine (ANTIVERT) 12.5 MG tablet, Take 1 tablet (12.5 mg total) by mouth 3 (three) times daily as needed for dizziness (will cause drowsiness). (Patient not taking: Reported on 05/08/2021), Disp:  30 tablet, Rfl: 0   montelukast (SINGULAIR) 10 MG tablet, Take 1 tablet (10 mg total) by mouth at bedtime., Disp: 90 tablet, Rfl: 3   Olopatadine HCl 0.2 % SOLN, Apply 1 drop to eye daily. (Patient not taking: Reported on 05/08/2021), Disp: 2.5 mL, Rfl: 1   predniSONE (DELTASONE) 10 MG tablet, Take 1 tablet (10 mg total) by mouth daily with breakfast. (Patient not taking: Reported on 05/08/2021), Disp: 10 tablet, Rfl: 0   Semaglutide,0.25 or 0.5MG/DOS, (OZEMPIC, 0.25 OR 0.5 MG/DOSE,) 2 MG/1.5ML SOPN, Inject 0.5 mg into the skin once a week., Disp: 1.5 mL, Rfl: 2   Physical exam:  Vitals:   07/01/21 1416  Pulse: 82  Resp: 16  Weight: 172 lb (78 kg)  Height: '5\' 6"'  (1.676 m)   Physical Exam Constitutional:      General: She is not in acute distress. Cardiovascular:     Rate and Rhythm: Normal rate and regular rhythm.     Heart sounds: Normal heart sounds.  Pulmonary:     Effort: Pulmonary effort is normal.     Breath sounds:  Normal breath sounds.  Abdominal:     General: Bowel sounds are normal.     Palpations: Abdomen is soft.     Comments: No palpable hepatosplenomegaly  Lymphadenopathy:     Comments: No palpable cervical, supraclavicular, axillary or inguinal adenopathy    Skin:    General: Skin is warm and dry.  Neurological:     Mental Status: She is alert and oriented to person, place, and time.           Latest Ref Rng & Units 05/06/2021   10:48 AM  CMP  Glucose 70 - 99 mg/dL 85   BUN 6 - 23 mg/dL 15   Creatinine 0.40 - 1.20 mg/dL 0.90   Sodium 135 - 145 mEq/L 138   Potassium 3.5 - 5.1 mEq/L 3.4   Chloride 96 - 112 mEq/L 101   CO2 19 - 32 mEq/L 25   Calcium 8.4 - 10.5 mg/dL 9.0   Total Protein 6.0 - 8.3 g/dL 6.9   Total Bilirubin 0.2 - 1.2 mg/dL 0.6   Alkaline Phos 39 - 117 U/L 57   AST 0 - 37 U/L 17   ALT 0 - 35 U/L 14       Latest Ref Rng & Units 06/10/2021    9:52 AM  CBC  WBC 3.8 - 10.8 Thousand/uL 10.0   Hemoglobin 11.7 - 15.5 g/dL 14.6   Hematocrit 35.0 - 45.0 % 41.6   Platelets 140 - 400 Thousand/uL 252     Assessment and plan- Patient is a 66 y.o. female referred for lymphocytosis  Patient has had mild lymphocytosis with an absolute lymphocyte count around 4.1.  White cell count has been also at the upper limit of normal around 10.  Hemoglobin and platelet counts have been normal.  Clinically there is no palpable adenopathy or splenomegaly.  Patient does not report any B symptoms.  Discussed differences between reactive and clonal lymphocytosis.  Clonal lymphocytosis can be seen in conditions such as CLL.  We discussed that even if CLL was diagnosed treatment is not usually indicated unless there are worsening cytopenias or B symptoms which the patient does not have.  I will proceed with a CBC with differential and peripheral flow cytometry at this time and see her back in 2 weeks time to discuss the results of blood work.  Bone marrow biopsy is not indicated at  this  time   Thank you for this kind referral and the opportunity to participate in the care of this patient   Visit Diagnosis 1. Lymphocytosis     Dr. Randa Evens, MD, MPH [facility] [phone] 07/01/2021

## 2021-07-03 LAB — COMP PANEL: LEUKEMIA/LYMPHOMA

## 2021-07-16 ENCOUNTER — Encounter: Payer: Self-pay | Admitting: Oncology

## 2021-07-16 ENCOUNTER — Inpatient Hospital Stay: Payer: Medicare Other | Attending: Oncology | Admitting: Oncology

## 2021-07-16 DIAGNOSIS — D72820 Lymphocytosis (symptomatic): Secondary | ICD-10-CM | POA: Diagnosis not present

## 2021-07-16 NOTE — Progress Notes (Signed)
I connected with Charlene Cox on 07/16/21 at  3:00 PM EDT by telephone visit and verified that I am speaking with the correct person using two identifiers.   I discussed the limitations, risks, security and privacy concerns of performing an evaluation and management service by telemedicine and the availability of in-person appointments. I also discussed with the patient that there may be a patient responsible charge related to this service. The patient expressed understanding and agreed to proceed.  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  Most recent CBC from 06/10/2021 showed a white cell count of 10, H&H of 14.6/41.6 and a platelet count of 252.  There is mild lymphocytosis with an absolute lymphocyte count of 4.1.  Looking back at her prior CBCs her white counts have been normal around 9.5 with a normal hemoglobin and platelet count with an absolute lymphocyte count which has been between 4-6 without a clear rising trend.   Results of the lab work from 07/01/2021 showed a white cell count of 9.1 with an absolute lymphocyte count of 3.8.Flow cytometry did not show any immunophenotypic abnormality.  Interval history patient is doing well and denies any specific complaints at this time   Review of Systems  Constitutional:  Negative for chills, fever, malaise/fatigue and weight loss.  HENT:  Negative for congestion, ear discharge and nosebleeds.   Eyes:  Negative for blurred vision.  Respiratory:  Negative for cough, hemoptysis, sputum production, shortness of breath and wheezing.   Cardiovascular:  Negative for chest pain, palpitations, orthopnea and claudication.   Gastrointestinal:  Negative for abdominal pain, blood in stool, constipation, diarrhea, heartburn, melena, nausea and vomiting.  Genitourinary:  Negative for dysuria, flank pain, frequency, hematuria and urgency.  Musculoskeletal:  Negative for back pain, joint pain and myalgias.  Skin:  Negative for rash.  Neurological:  Negative for dizziness, tingling, focal weakness, seizures, weakness and headaches.  Endo/Heme/Allergies:  Does not bruise/bleed easily.  Psychiatric/Behavioral:  Negative for depression and suicidal ideas. The patient does not have insomnia.     Allergies  Allergen Reactions   Zanaflex [Tizanidine]     ...after 1 pill of that muscle relaxer about 4 hours later when I got up to pee there was a discomfort it seemed with my urinary track almost like a serious UTI.  I took Tylenol and it began to subside...it had to be my reaction to the muscle relaxer..thanks so much. Teresa   Miralax [Polyethylene Glycol] Other (See Comments)    ?reaction stomach cramp per patient.       Past Medical History:  Diagnosis Date   Allergy    Anxiety    needs with flying, bloodwork   Arthritis    Chronic low back pain    COVID-19    x2   HTN (hypertension) 03/28/2016   Osteoarthritis of left hip    severe determined by x-ray    Past Surgical History:  Procedure Laterality Date   COSMETIC SURGERY  07/2018   tummy tuck and facelift    Social History   Socioeconomic History   Marital status: Married    Spouse name: Not on file   Number of children: Not on file   Years of education: Not  on file   Highest education level: Not on file  Occupational History   Occupation: semi retired  Tobacco Use   Smoking status: Never   Smokeless tobacco: Never  Vaping Use   Vaping Use: Never used  Substance and Sexual Activity   Alcohol use: No    Alcohol/week: 0.0 standard drinks of alcohol   Drug use: No   Sexual activity: Not Currently  Other Topics Concern   Not on file   Social History Narrative   Not on file   Social Determinants of Health   Financial Resource Strain: Low Risk  (06/27/2020)   Overall Financial Resource Strain (CARDIA)    Difficulty of Paying Living Expenses: Not hard at all  Food Insecurity: No Food Insecurity (06/27/2020)   Hunger Vital Sign    Worried About Running Out of Food in the Last Year: Never true    [place] in the Last Year: Never true  Transportation Needs: No Transportation Needs (06/27/2020)   PRAPARE - Hydrologist (Medical): No    Lack of Transportation (Non-Medical): No  Physical Activity: Inactive (06/27/2020)   Exercise Vital Sign    Days of Exercise per Week: 0 days    Minutes of Exercise per Session: 0 min  Stress: No Stress Concern Present (06/27/2020)   [place]    Feeling of Stress : Not at all  Social Connections: Not on file  Intimate Partner Violence: Not on file    History reviewed. No pertinent family history.   Current Outpatient Medications:    albuterol (PROAIR HFA) 108 (90 Base) MCG/ACT inhaler, Inhale 1-2 puffs into the lungs every 6 (six) hours as needed for wheezing or shortness of breath., Disp: 18 g, Rfl: 11   Semaglutide,0.25 or 0.'5MG'$ /DOS, (OZEMPIC, 0.25 OR 0.5 MG/DOSE,) 2 MG/1.5ML SOPN, Inject 0.5 mg into the skin once a week., Disp: 1.5 mL, Rfl: 2   ALPRAZolam (XANAX) 0.5 MG tablet, Take 1 tablet (0.5 mg total) by mouth daily as needed for up to 1 dose for anxiety. For flight anxiety. (Patient not taking: Reported on 07/16/2021), Disp: 30 tablet, Rfl: 0   citalopram (CELEXA) 20 MG tablet, Take 20 mg by mouth daily. (Patient not taking: Reported on 05/08/2021), Disp: , Rfl:    cyclobenzaprine (FLEXERIL) 5 MG tablet, TAKE 1 TABLET BY MOUTH AT BEDTIME AS NEEDED FOR MUSCLE SPASMS. (Patient not taking: Reported on 07/16/2021), Disp: 30 tablet, Rfl: 5   dextromethorphan-guaiFENesin (MUCINEX DM) 30-600  MG 12hr tablet, Take 1 tablet by mouth 2 (two) times daily as needed for cough. (Patient not taking: Reported on 07/16/2021), Disp: 60 tablet, Rfl: 0   fluticasone (FLONASE) 50 MCG/ACT nasal spray, Place 2 sprays into both nostrils daily. (Patient not taking: Reported on 07/16/2021), Disp: 16 g, Rfl: 6   montelukast (SINGULAIR) 10 MG tablet, Take 1 tablet (10 mg total) by mouth at bedtime. (Patient not taking: Reported on 07/16/2021), Disp: 90 tablet, Rfl: 3  No results found.  No images are attached to the encounter.      Latest Ref Rng & Units 05/06/2021   10:48 AM  CMP  Glucose 70 - 99 mg/dL 85   BUN 6 - 23 mg/dL 15   Creatinine 0.40 - 1.20 mg/dL 0.90   Sodium 135 - 145 mEq/L 138   Potassium 3.5 - 5.1 mEq/L 3.4   Chloride 96 - 112 mEq/L 101   CO2 19 - 32 mEq/L  25   Calcium 8.4 - 10.5 mg/dL 9.0   Total Protein 6.0 - 8.3 g/dL 6.9   Total Bilirubin 0.2 - 1.2 mg/dL 0.6   Alkaline Phos 39 - 117 U/L 57   AST 0 - 37 U/L 17   ALT 0 - 35 U/L 14       Latest Ref Rng & Units 07/01/2021    2:46 PM  CBC  WBC 4.0 - 10.5 K/uL 9.1   Hemoglobin 12.0 - 15.0 g/dL 13.8   Hematocrit 36.0 - 46.0 % 40.0   Platelets 150 - 400 K/uL 261      Assessment and plan: Patient Is a 66 year old Female Referred for Lymphocytosis  Discussed the results of flow cytometry with the patient which does not show any evidence of immunophenotypic abnormality.  Rise of multiple lymphocyte subsets suggestive of a reactive etiology.  This is therefore not malignant and therefore patient does not require any hematology follow-up at this time.  She also does not have any contraindications from hematology standpoint to go for her hip surgery  Follow-up instructions: No follow-up needed  I discussed the assessment and treatment plan with the patient. The patient was provided an opportunity to ask questions and all were answered. The patient agreed with the plan and demonstrated an understanding of the instructions.   The  patient was advised to call back or seek an in-person evaluation if the symptoms worsen or if the condition fails to improve as anticipated.  I provided 11 minutes of non face-to-face telephone visit time during this encounter, and > 50% was spent counseling, reviewing labs and results as documented under my assessment & plan.  Visit Diagnosis: 1. Lymphocytosis     Dr. Randa Evens, MD, MPH [facility] Tel- [phone] 07/16/2021 3:57 PM

## 2021-07-17 ENCOUNTER — Ambulatory Visit (INDEPENDENT_AMBULATORY_CARE_PROVIDER_SITE_OTHER): Payer: Medicare Other

## 2021-07-17 VITALS — Ht 66.0 in | Wt 172.0 lb

## 2021-07-17 DIAGNOSIS — Z1211 Encounter for screening for malignant neoplasm of colon: Secondary | ICD-10-CM

## 2021-07-17 DIAGNOSIS — Z0000 Encounter for general adult medical examination without abnormal findings: Secondary | ICD-10-CM | POA: Diagnosis not present

## 2021-07-17 NOTE — Patient Instructions (Addendum)
Charlene Cox , Thank you for taking time to come for your Medicare Wellness Visit. I appreciate your ongoing commitment to your health goals. Please review the following plan we discussed and let me know if I can assist you in the future.   These are the goals we discussed:  Goals      Healthy lifestyle     Stay active Healthy diet     Patient Stated     01/19/2019, wants to start working out in the home gym     Patient Stated     06/27/2020, get down to 165, eat a healthier        This is a list of the screening recommended for you and due dates:  Health Maintenance  Topic Date Due   Colon Cancer Screening  Never done   COVID-19 Vaccine (5 - Pfizer series) 08/02/2021*   Tetanus Vaccine  09/12/2021*   Zoster (Shingles) Vaccine (2 of 2) 10/17/2021*   Pneumonia Vaccine (1 - PCV) 07/18/2022*   Flu Shot  08/12/2021   Mammogram  05/17/2023   DEXA scan (bone density measurement)  Completed   Hepatitis C Screening: USPSTF Recommendation to screen - Ages 21-79 yo.  Completed   HPV Vaccine  Aged Out   Urine Protein Check  Discontinued  *Topic was postponed. The date shown is not the original due date.   Colonoscopy, Adult A colonoscopy is a procedure to look at the entire large intestine. This procedure is done using a long, thin, flexible tube that has a camera on the end. You may have a colonoscopy: As a part of normal colorectal screening. If you have certain symptoms, such as: A low number of red blood cells in your blood (anemia). Diarrhea that does not go away. Pain in your abdomen. Blood in your stool. A colonoscopy can help screen for and diagnose medical problems, including: An abnormal growth of cells or tissue (tumor). Abnormal growths within the lining of your intestine (polyps). Inflammation. Areas of bleeding. Tell your health care provider about: Any allergies you have. All medicines you are taking, including vitamins, herbs, eye drops, creams, and  over-the-counter medicines. Any problems you or family members have had with anesthetic medicines. Any bleeding problems you have. Any surgeries you have had. Any medical conditions you have. Any problems you have had with having bowel movements. Whether you are pregnant or may be pregnant. What are the risks? Generally, this is a safe procedure. However, problems may occur, including: Bleeding. Damage to your intestine. Allergic reactions to medicines given during the procedure. Infection. This is rare. What happens before the procedure? Eating and drinking restrictions Follow instructions from your health care provider about eating or drinking restrictions, which may include: A few days before the procedure: Follow a low-fiber diet. Avoid nuts, seeds, dried fruit, raw fruits, and vegetables. 1-3 days before the procedure: Eat only gelatin dessert or ice pops. Drink only clear liquids, such as water, clear juice, clear broth or bouillon, black coffee or tea, or clear soft drinks or sports drinks. Avoid liquids that contain red or purple dye. The day of the procedure: Do not eat solid foods. You may continue to drink clear liquids until up to 2 hours before the procedure. Do not eat or drink anything starting 2 hours before the procedure, or within the time period that your health care provider recommends. Bowel prep If you were prescribed a bowel prep to take by mouth (orally) to clean out your colon: Take it  as told by your health care provider. Starting the day before your procedure, you will need to drink a large amount of liquid medicine. The liquid will cause you to have many bowel movements of loose stool until your stool becomes almost clear or light green. If your skin or the opening between the buttocks (anus) gets irritated from diarrhea, you may relieve the irritation using: Wipes with medicine in them, such as adult wet wipes with aloe and vitamin E. A product to soothe  skin, such as petroleum jelly. If you vomit while drinking the bowel prep: Take a break for up to 60 minutes. Begin the bowel prep again. Call your health care provider if you keep vomiting or you cannot take the bowel prep without vomiting. To clean out your colon, you may also be given: Laxative medicines. These help you have a bowel movement. Instructions for enema use. An enema is liquid medicine injected into your rectum. Medicines Ask your health care provider about: Changing or stopping your regular medicines or supplements. This is especially important if you are taking iron supplements, diabetes medicines, or blood thinners. Taking medicines such as aspirin and ibuprofen. These medicines can thin your blood. Do not take these medicines unless your health care provider tells you to take them. Taking over-the-counter medicines, vitamins, herbs, and supplements. General instructions Ask your health care provider what steps will be taken to help prevent infection. These may include washing skin with a germ-killing soap. If you will be going home right after the procedure, plan to have a responsible adult: Take you home from the hospital or clinic. You will not be allowed to drive. Care for you for the time you are told. What happens during the procedure?  An IV will be inserted into one of your veins. You will be given a medicine to make you fall asleep (general anesthetic). You will lie on your side with your knees bent. A lubricant will be put on the tube. Then the tube will be: Inserted into your anus. Gently eased through all parts of your large intestine. Air will be sent into your colon to keep it open. This may cause some pressure or cramping. Images will be taken with the camera and will appear on a screen. A small tissue sample may be removed to be looked at under a microscope (biopsy). The tissue may be sent to a lab for testing if any signs of problems are found. If small  polyps are found, they may be removed and checked for cancer cells. When the procedure is finished, the tube will be removed. The procedure may vary among health care providers and hospitals. What happens after the procedure? Your blood pressure, heart rate, breathing rate, and blood oxygen level will be monitored until you leave the hospital or clinic. You may have a small amount of blood in your stool. You may pass gas and have mild cramping or bloating in your abdomen. This is caused by the air that was used to open your colon during the exam. If you were given a sedative during the procedure, it can affect you for several hours. Do not drive or operate machinery until your health care provider says that it is safe. It is up to you to get the results of your procedure. Ask your health care provider, or the department that is doing the procedure, when your results will be ready. Summary A colonoscopy is a procedure to look at the entire large intestine. Follow instructions from  your health care provider about eating and drinking before the procedure. If you were prescribed an oral bowel prep to clean out your colon, take it as told by your health care provider. During the colonoscopy, a flexible tube with a camera on its end is inserted into the anus and then passed into all parts of the large intestine. This information is not intended to replace advice given to you by your health care provider. Make sure you discuss any questions you have with your health care provider. Document Revised: 12/23/2020 Document Reviewed: 08/21/2020 Elsevier Patient Education  Universal.

## 2021-07-17 NOTE — Progress Notes (Signed)
Subjective:   Charlene Cox is a 66 y.o. female who presents for Medicare Annual (Subsequent) preventive examination.  Review of Systems    No ROS.  Medicare Wellness Virtual Visit.  Visual/audio telehealth visit, UTA vital signs.   See social history for additional risk factors.   Cardiac Risk Factors include: advanced age (>56mn, >>31women)     Objective:    Today's Vitals   07/17/21 1038  Weight: 172 lb (78 kg)  Height: '5\' 6"'$  (1.676 m)   Body mass index is 27.76 kg/m.     07/17/2021    1:31 PM 07/01/2021    2:03 PM 07/01/2021    1:30 PM 06/27/2020   12:09 PM 01/19/2019    2:28 PM  Advanced Directives  Does Patient Have a Medical Advance Directive? No No No No No  Does patient want to make changes to medical advance directive? No - Patient declined Yes (MAU/Ambulatory/Procedural Areas - Information given)     Would patient like information on creating a medical advance directive?  Yes (MAU/Ambulatory/Procedural Areas - Information given)   Yes (MAU/Ambulatory/Procedural Areas - Information given)    Current Medications (verified) Outpatient Encounter Medications as of 07/17/2021  Medication Sig   albuterol (PROAIR HFA) 108 (90 Base) MCG/ACT inhaler Inhale 1-2 puffs into the lungs every 6 (six) hours as needed for wheezing or shortness of breath.   ALPRAZolam (XANAX) 0.5 MG tablet Take 1 tablet (0.5 mg total) by mouth daily as needed for up to 1 dose for anxiety. For flight anxiety. (Patient not taking: Reported on 07/16/2021)   citalopram (CELEXA) 20 MG tablet Take 20 mg by mouth daily. (Patient not taking: Reported on 05/08/2021)   cyclobenzaprine (FLEXERIL) 5 MG tablet TAKE 1 TABLET BY MOUTH AT BEDTIME AS NEEDED FOR MUSCLE SPASMS. (Patient not taking: Reported on 07/16/2021)   dextromethorphan-guaiFENesin (MUCINEX DM) 30-600 MG 12hr tablet Take 1 tablet by mouth 2 (two) times daily as needed for cough. (Patient not taking: Reported on 07/16/2021)   fluticasone (FLONASE) 50  MCG/ACT nasal spray Place 2 sprays into both nostrils daily. (Patient not taking: Reported on 07/16/2021)   montelukast (SINGULAIR) 10 MG tablet Take 1 tablet (10 mg total) by mouth at bedtime. (Patient not taking: Reported on 07/16/2021)   Semaglutide,0.25 or 0.'5MG'$ /DOS, (OZEMPIC, 0.25 OR 0.5 MG/DOSE,) 2 MG/1.5ML SOPN Inject 0.5 mg into the skin once a week.   No facility-administered encounter medications on file as of 07/17/2021.    Allergies (verified) Zanaflex [tizanidine] and Miralax [polyethylene glycol]   History: Past Medical History:  Diagnosis Date   Allergy    Anxiety    needs with flying, bloodwork   Arthritis    Chronic low back pain    COVID-19    x2   HTN (hypertension) 03/28/2016   Osteoarthritis of left hip    severe determined by x-ray   Past Surgical History:  Procedure Laterality Date   COSMETIC SURGERY  07/2018   tummy tuck and facelift   History reviewed. No pertinent family history. Social History   Socioeconomic History   Marital status: Married    Spouse name: Not on file   Number of children: Not on file   Years of education: Not on file   Highest education level: Not on file  Occupational History   Occupation: semi retired  Tobacco Use   Smoking status: Never   Smokeless tobacco: Never  Vaping Use   Vaping Use: Never used  Substance and Sexual Activity   Alcohol  use: No    Alcohol/week: 0.0 standard drinks of alcohol   Drug use: No   Sexual activity: Not Currently  Other Topics Concern   Not on file  Social History Narrative   Not on file   Social Determinants of Health   Financial Resource Strain: Low Risk  (07/17/2021)   Overall Financial Resource Strain (CARDIA)    Difficulty of Paying Living Expenses: Not hard at all  Food Insecurity: No Food Insecurity (07/17/2021)   Hunger Vital Sign    Worried About Running Out of Food in the Last Year: Never true    Ran Out of Food in the Last Year: Never true  Transportation Needs: No  Transportation Needs (07/17/2021)   PRAPARE - Hydrologist (Medical): No    Lack of Transportation (Non-Medical): No  Physical Activity: Inactive (06/27/2020)   Exercise Vital Sign    Days of Exercise per Week: 0 days    Minutes of Exercise per Session: 0 min  Stress: Stress Concern Present (07/17/2021)   [place]    Feeling of Stress : Rather much  Social Connections: Unknown (07/17/2021)   Social Connection and Isolation Panel [NHANES]    Frequency of Communication with Friends and Family: More than three times a week    Frequency of Social Gatherings with Friends and Family: Not on file    Attends Religious Services: Not on file    Active Member of Clubs or Organizations: Not on file    Attends Archivist Meetings: Not on file    Marital Status: Not on file    Tobacco Counseling Counseling given: Not Answered   Clinical Intake:  Pre-visit preparation completed: Yes        Diabetes: No  How often do you need to have someone help you when you read instructions, pamphlets, or other written materials from your doctor or pharmacy?: 1 - Never   Interpreter Needed?: No    Activities of Daily Living    07/17/2021   10:45 AM  In your present state of health, do you have any difficulty performing the following activities:  Hearing? 0  Vision? 0  Difficulty concentrating or making decisions? 0  Walking or climbing stairs? 0  Dressing or bathing? 0  Doing errands, shopping? 0  Preparing Food and eating ? N  Using the Toilet? N  In the past six months, have you accidently leaked urine? N  Do you have problems with loss of bowel control? N  Managing your Medications? N  Managing your Finances? N  Housekeeping or managing your Housekeeping? N    Patient Care Team: McLean-Scocuzza, Nino Glow, MD as PCP - General (Internal Medicine)  Indicate any recent Medical Services you  may have received from other than [facility] providers in the past year (date may be approximate).     Assessment:   This is a routine wellness examination for Charlene Cox.  Virtual Visit via Telephone Note  I connected with  Charlene Cox on 07/17/21 at 10:30 AM EDT by telephone and verified that I am speaking with the correct person using two identifiers.  Persons participating in the virtual visit: patient/Nurse Health Advisor   I discussed the limitations of performing an evaluation and management service by telehealth. We continued and completed visit with audio only. Some vital signs may be absent or patient reported.   Hearing/Vision screen Hearing Screening - Comments:: Patient is able to hear conversational tones  without difficulty.  No issues reported. Vision Screening - Comments:: Followed by [facility] Wears corrective lenses  They have seen their ophthalmologist in the last 12 months.    Dietary issues and exercise activities discussed:   Regular diet    Goals Addressed             This Visit's Progress    Healthy lifestyle       Stay active Healthy diet       Depression Screen    07/17/2021   10:41 AM 04/25/2021    3:50 PM 04/16/2021    3:04 PM 02/12/2021    5:01 PM 01/16/2021    2:33 PM 06/27/2020   12:10 PM 01/19/2019    2:30 PM  PHQ 2/9 Scores  PHQ - 2 Score 1 0 '1 3 2 '$ 0 0  PHQ- 9 Score    9   0    Fall Risk    07/17/2021   10:41 AM 04/25/2021    3:50 PM 04/16/2021    3:04 PM 02/12/2021    5:00 PM 01/16/2021    2:33 PM  [place] in the past year? 0 0 0 0 0  Number falls in past yr:  0 0 1 0  Injury with Fall?  0 0 0 0  Risk for fall due to :  No Fall Risks No Fall Risks History of fall(s)   Follow up Falls evaluation completed Falls evaluation completed Falls evaluation completed Falls evaluation completed Falls evaluation completed    [place]: Home free of loose throw rugs in walkways, pet beds,  electrical cords, etc? Yes  Adequate lighting in your home to reduce risk of falls? Yes   ASSISTIVE DEVICES UTILIZED TO PREVENT FALLS: Life alert? No  Use of a cane, walker or w/c? No   TIMED UP AND GO: Was the test performed? No .   Cognitive Function:  Patient is alert and oriented x3.       06/27/2020   12:12 PM 01/19/2019    2:33 PM  6CIT Screen  What Year? 0 points 0 points  What month? 0 points 0 points  What time? 0 points 0 points  Count back from 20 0 points 0 points  Months in reverse 0 points 0 points  Repeat phrase 0 points 0 points  Total Score 0 points 0 points    Immunizations Immunization History  Administered Date(s) Administered   PFIZER(Purple Top)SARS-COV-2 Vaccination 02/09/2019, 03/09/2019, 11/13/2019   Pfizer Covid-19 Vaccine Bivalent Booster 49yr & up 11/07/2020   Zoster Recombinat (Shingrix) 11/13/2019   TDAP status: Due, Education has been provided regarding the importance of this vaccine. Advised may receive this vaccine at local pharmacy or Health Dept. Aware to provide a copy of the vaccination record if obtained from local pharmacy or Health Dept. Verbalized acceptance and understanding.  Pneumococcal vaccine status: Due, Education has been provided regarding the importance of this vaccine. Advised may receive this vaccine at local pharmacy or Health Dept. Aware to provide a copy of the vaccination record if obtained from local pharmacy or Health Dept. Verbalized acceptance and understanding.  Shingrix vaccine- first dose completed  Screening Tests Health Maintenance  Topic Date Due   COLONOSCOPY (Pts 45-432yrInsurance coverage will need to be confirmed)  Never done   COVID-19 Vaccine ([place]) 08/02/2021 (Originally 03/10/2021)   TETANUS/TDAP  09/12/2021 (Originally 02/13/1974)   Zoster Vaccines- Shingrix (2 of 2)  10/17/2021 (Originally 01/08/2020)   Pneumonia Vaccine 16+ Years old (1 - PCV) 07/18/2022 (Originally 02/12/2018)    INFLUENZA VACCINE  08/12/2021   MAMMOGRAM  05/17/2023   DEXA SCAN  Completed   Hepatitis C Screening  Completed   HPV VACCINES  Aged Out   URINE MICROALBUMIN  Discontinued    Health Maintenance Health Maintenance Due  Topic Date Due   COLONOSCOPY (Pts 45-84yr Insurance coverage will need to be confirmed)  Never done   Colonoscopy- ordered per consent.  Lung Cancer Screening: (Low Dose CT Chest recommended if Age 68-80years, 30 pack-year currently smoking OR have quit w/in 15years.) does not qualify.   Vision Screening: Recommended annual ophthalmology exams for early detection of glaucoma and other disorders of the eye.  Dental Screening: Recommended annual dental exams for proper oral hygiene  Community Resource Referral / Chronic Care Management: CRR required this visit?  No   CCM required this visit?  No      Plan:   Keep all routine maintenance appointments.   I have personally reviewed and noted the following in the patient's chart:   Medical and social history Use of alcohol, tobacco or illicit drugs  Current medications and supplements including opioid prescriptions.  Functional ability and status Nutritional status Physical activity Advanced directives List of other physicians Hospitalizations, surgeries, and ER visits in previous 12 months Vitals Screenings to include cognitive, depression, and falls Referrals and appointments  In addition, I have reviewed and discussed with patient certain preventive protocols, quality metrics, and best practice recommendations. A written personalized care plan for preventive services as well as general preventive health recommendations were provided to patient.     OVarney Biles LPN   [id number]

## 2021-07-17 NOTE — Addendum Note (Signed)
Addended by: Orland Mustard on: 07/17/2021 02:13 PM   Modules accepted: Orders

## 2021-07-18 ENCOUNTER — Telehealth (INDEPENDENT_AMBULATORY_CARE_PROVIDER_SITE_OTHER): Payer: Medicare Other | Admitting: Internal Medicine

## 2021-07-18 ENCOUNTER — Encounter: Payer: Self-pay | Admitting: Internal Medicine

## 2021-07-18 DIAGNOSIS — R41840 Attention and concentration deficit: Secondary | ICD-10-CM | POA: Diagnosis not present

## 2021-07-18 DIAGNOSIS — G4700 Insomnia, unspecified: Secondary | ICD-10-CM | POA: Diagnosis not present

## 2021-07-18 DIAGNOSIS — J9801 Acute bronchospasm: Secondary | ICD-10-CM | POA: Diagnosis not present

## 2021-07-18 DIAGNOSIS — F338 Other recurrent depressive disorders: Secondary | ICD-10-CM

## 2021-07-18 DIAGNOSIS — D72820 Lymphocytosis (symptomatic): Secondary | ICD-10-CM | POA: Diagnosis not present

## 2021-07-18 DIAGNOSIS — M25552 Pain in left hip: Secondary | ICD-10-CM | POA: Diagnosis not present

## 2021-07-18 DIAGNOSIS — M1712 Unilateral primary osteoarthritis, left knee: Secondary | ICD-10-CM

## 2021-07-18 DIAGNOSIS — F411 Generalized anxiety disorder: Secondary | ICD-10-CM

## 2021-07-18 MED ORDER — BUPROPION HCL ER (XL) 150 MG PO TB24: 150.0000 mg | ORAL_TABLET | Freq: Every day | ORAL | 3 refills | Status: DC

## 2021-07-18 MED ORDER — ALBUTEROL SULFATE HFA 108 (90 BASE) MCG/ACT IN AERS: 1.0000 | INHALATION_SPRAY | Freq: Four times a day (QID) | RESPIRATORY_TRACT | 11 refills | Status: DC | PRN

## 2021-07-18 MED ORDER — TRAZODONE HCL 50 MG PO TABS: 50.0000 mg | ORAL_TABLET | Freq: Every evening | ORAL | 3 refills | Status: DC | PRN

## 2021-07-18 NOTE — Progress Notes (Addendum)
Virtual Visit via Video Note  I connected with Charlene Cox  on 07/18/21 at  3:40 PM EDT by a video enabled telemedicine application and verified that I am speaking with the correct person using two identifiers.  Location patient:  Location provider:work or home office Persons participating in the virtual visit: patient, provider  I discussed the limitations and requested verbal permission for telemedicine visit. The patient expressed understanding and agreed to proceed.   HPI:  Acute telemedicine visit for : PH9 score today 1 and Anxiety gad 7 11 today tried prozac, lexapro 30 years ago when going through divorce and domestic issues does not remember if helped and recently tried celexa 20 mg did not help. She thinks she had lack of motivation and interest at times, too much worry, had trauma also in the past from her dad who she loved dying at 61 years old. At times she has insomnia and her mind races. And has focus/concentration/attention issues and seasonal affective disorder hates fall and winter. She has notice these changes x 5 years. She has tried someones adderral and her husbands trazadone 50 mg not sure if this helped. Other stressors her daughter with twin 74 y.o grandsons and her daughter was in [employer] x 20 years and she raised her to be a strong women but does not agree with her parenting and tell her this at times   Attention an issues has trouble completing household tasks at home will go from 1 task to another and does not finish or when in conversations will go from 1 topic to the next      -Pertinent past medical history: see below -Pertinent medication allergies: Allergies  Allergen Reactions   Zanaflex [Tizanidine]     ...after 1 pill of that muscle relaxer about 4 hours later when I got up to pee there was a discomfort it seemed with my urinary track almost like a serious UTI.  I took Tylenol and it began to subside...it had to be my reaction to the muscle  relaxer..thanks so much. Teresa   Miralax [Polyethylene Glycol] Other (See Comments)    ?reaction stomach cramp per patient.      -COVID-19 vaccine status:  Immunization History  Administered Date(s) Administered   PFIZER(Purple Top)SARS-COV-2 Vaccination 02/09/2019, 03/09/2019, 11/13/2019   Pfizer Covid-19 Vaccine Bivalent Booster 64yr & up 11/07/2020   Zoster Recombinat (Shingrix) 11/13/2019     ROS: See pertinent positives and negatives per HPI.  Past Medical History:  Diagnosis Date   Allergy    Anxiety    needs with flying, bloodwork   Arthritis    Chronic low back pain    COVID-19    x2   HTN (hypertension) 03/28/2016   Osteoarthritis of left hip    severe determined by x-ray    Past Surgical History:  Procedure Laterality Date   COSMETIC SURGERY  07/2018   tummy tuck and facelift     Current Outpatient Medications:    buPROPion (WELLBUTRIN XL) 150 MG 24 hr tablet, Take 1 tablet (150 mg total) by mouth daily., Disp: 90 tablet, Rfl: 3   Semaglutide,0.25 or 0.'5MG'$ /DOS, (OZEMPIC, 0.25 OR 0.5 MG/DOSE,) 2 MG/1.5ML SOPN, Inject 0.5 mg into the skin once a week., Disp: 1.5 mL, Rfl: 2   traZODone (DESYREL) 50 MG tablet, Take 1-2 tablets (50-100 mg total) by mouth at bedtime as needed for sleep., Disp: 60 tablet, Rfl: 3   albuterol (PROAIR HFA) 108 (90 Base) MCG/ACT inhaler, Inhale 1-2 puffs into the lungs  every 6 (six) hours as needed for wheezing or shortness of breath., Disp: 18 g, Rfl: 11   ALPRAZolam (XANAX) 0.5 MG tablet, Take 1 tablet (0.5 mg total) by mouth daily as needed for up to 1 dose for anxiety. For flight anxiety. (Patient not taking: Reported on 07/16/2021), Disp: 30 tablet, Rfl: 0   cyclobenzaprine (FLEXERIL) 5 MG tablet, TAKE 1 TABLET BY MOUTH AT BEDTIME AS NEEDED FOR MUSCLE SPASMS. (Patient not taking: Reported on 07/16/2021), Disp: 30 tablet, Rfl: 5   fluticasone (FLONASE) 50 MCG/ACT nasal spray, Place 2 sprays into both nostrils daily. (Patient not taking:  Reported on 07/16/2021), Disp: 16 g, Rfl: 6   montelukast (SINGULAIR) 10 MG tablet, Take 1 tablet (10 mg total) by mouth at bedtime. (Patient not taking: Reported on 07/16/2021), Disp: 90 tablet, Rfl: 3  EXAM:  VITALS per patient if applicable:  GENERAL: alert, oriented, appears well and in no acute distress  HEENT: atraumatic, conjunttiva clear, no obvious abnormalities on inspection of external nose and ears  NECK: normal movements of the head and neck  LUNGS: on inspection no signs of respiratory distress, breathing rate appears normal, no obvious gross SOB, gasping or wheezing  CV: no obvious cyanosis  MS: moves all visible extremities without noticeable abnormality  PSYCH/NEURO: pleasant and cooperative, no obvious depression or anxiety, speech and thought processing grossly intact Somewhat tearful toda y  ASSESSMENT AND PLAN:  Discussed the following assessment and plan:  GAD (generalized anxiety disorder) gad 7 score 11 today phq 9 score 11  Has prn xanax  Tried prozac/lexapro 30 years ago ? If helped and celexa 20 mg recently did not help  Consider effexor in the future 37.5 mg xr if wellbutrin 150 mg qd does not help with attention and mood pt preferred to start this 1st with h/o SAD  Insomnia, unspecified type - Plan: traZODone (DESYREL) 50 -100 MG tablet qhs prn Consider lunesta 1 mg qhs prn trazadone does not help   Attention and concentration deficit - Plan: buPROPion (WELLBUTRIN XL) 150 MG 24 hr tablet Seasonal affective disorder ([place]) - Plan: buPROPion (WELLBUTRIN XL) 150 MG 24 hr tablet  Consider therapy and psychiatry   [facility] as given the info before for both  [facility] [place]  [place] [phone]    [facility] [place]  [address]  [place] [address]  [phone]   Also there is [facility] for therapy   [facility] in [place]    Bronchospasm - Plan: albuterol (PROAIR HFA) 108 (90 Base) MCG/ACT inhaler   Left hip pain and left knee arthritis noted 07/2016 Xray knee  Was scheduled to see Dr. Rosanna [facility] but pt desires 2nd opinion  Will refer Dr. 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  HM Flu shot no  Tdap consider in the future if not had 4/4 pfizer  Shingrix 1/2 11/2019 consider re-vx if not had 2nd shot Consider pna 20 in the future  Hep C neg   On/gyn Dr. Lance Morin   Mammogram 05/16/21 neg  DEXA 05/16/21 T -1.5 osteopenia   Colonoscopy referred Dr. Benson [place] previously   Eye referred [facility]   Rec healthy diet and exercise   Other specialists: Established with [facility] Dr.Brett gilbert in [facility] [place] TBD eft hip surgery 05/28/21 moved back now  Established with pulmonary Dr. Ander Slade     -we discussed possible serious and likely etiologies, options for evaluation and workup, limitations of telemedicine visit vs in person visit, treatment, treatment risks and precautions. Pt is agreeable to treatment via telemedicine at this moment.   I discussed the assessment and treatment plan with the patient. The patient was provided an opportunity to ask questions and all were answered. The patient agreed with the plan and demonstrated an understanding of the instructions.    Time 20 minutes Delorise Jackson, MD

## 2021-07-18 NOTE — Patient Instructions (Addendum)
[employer] as given the info before for both  [facility] [place]  [place] [address] [phone]    [facility] [place]  [address]  [place] [address]  [phone]   Also there is [facility] for therapy   [facility] [place]   Bupropion Extended-Release Tablets (Depression/Mood Disorders) What is this medication? BUPROPION (byoo PROE pee on) treats depression. It increases norepinephrine and dopamine in the brain, hormones that help regulate mood. It belongs to a group of medications called NDRIs. This medicine may be used for other purposes; ask your health care provider or pharmacist if you have questions. COMMON BRAND NAME(S): Aplenzin, Budeprion XL, Forfivo XL, Wellbutrin XL What should I tell my care team before I take this medication? They need to know if you have any of these conditions: An eating disorder, such as anorexia or bulimia Bipolar disorder or psychosis Diabetes or high blood sugar, treated with medication Glaucoma Head injury or brain tumor Heart disease, previous heart attack, or irregular heart beat High blood pressure Kidney or liver disease Seizures (convulsions) Suicidal thoughts or a previous suicide attempt Tourette's syndrome Weight loss An unusual or allergic reaction to bupropion, other medications, foods, dyes, or preservatives Pregnant or trying to become pregnant Breast-feeding How should I use this medication? Take this medication by mouth with a glass of water. Follow the directions on the prescription label. You can take it with or without food. If it upsets your stomach, take it with food. Do not crush, chew, or cut these tablets. This medication is taken once daily at the same time each day. Do not take your medication more often than directed. Do not stop taking this medication suddenly except upon the advice of your care team.  Stopping this medication too quickly may cause serious side effects or your condition may worsen. A special MedGuide will be given to you by the pharmacist with each prescription and refill. Be sure to read this information carefully each time. Talk to your care team about the use of this medication in children. Special care may be needed. Overdosage: If you think you have taken too much of this medicine contact a poison control center or emergency room at once. NOTE: This medicine is only for you. Do not share this medicine with others. What if I miss a dose? If you miss a dose, skip the missed dose and take your next tablet at the regular time. Do not take double or extra doses. What may interact with this medication? Do not take this medication with any of the following: Linezolid MAOIs like Azilect, Carbex, Eldepryl, Marplan, Nardil, and Parnate Methylene blue (injected into a vein) Other medications that contain bupropion like Zyban This medication may also interact with the following: Alcohol Certain medications for anxiety or sleep Certain medications for blood pressure like metoprolol, propranolol Certain medications for depression or psychotic disturbances Certain medications for HIV or AIDS like efavirenz, lopinavir, nelfinavir, ritonavir Certain medications for irregular heart beat like propafenone, flecainide Certain medications for Parkinson's disease like amantadine, levodopa Certain medications for seizures like carbamazepine, phenytoin, phenobarbital Cimetidine Clopidogrel Cyclophosphamide Digoxin Furazolidone Isoniazid Nicotine Orphenadrine Procarbazine Steroid medications like prednisone or cortisone Stimulant medications for attention disorders, weight loss, or to stay awake Tamoxifen Theophylline Thiotepa Ticlopidine Tramadol Warfarin This list may not describe all possible interactions. Give your health care provider a list of all the medicines, herbs,  non-prescription drugs,  or dietary supplements you use. Also tell them if you smoke, drink alcohol, or use illegal drugs. Some items may interact with your medicine. What should I watch for while using this medication? Tell your care team if your symptoms do not get better or if they get worse. Visit your care team for regular checks on your progress. Because it may take several weeks to see the full effects of this medication, it is important to continue your treatment as prescribed. Watch for new or worsening thoughts of suicide or depression. This includes sudden changes in mood, behavior, or thoughts. These changes can happen at any time but are more common in the beginning of treatment or after a change in dose. Call your care team right away if you experience these thoughts or worsening depression. Manic episodes may happen in patients with bipolar disorder who take this medication. Watch for changes in feelings or behaviors such as feeling anxious, nervous, agitated, panicky, irritable, hostile, aggressive, impulsive, severely restless, overly excited and hyperactive, or trouble sleeping. These symptoms can happen at anytime but are more common in the beginning of treatment or after a change in dose. Call your care team right away if you notice any of these symptoms. This medication may cause serious skin reactions. They can happen weeks to months after starting the medication. Contact your care team right away if you notice fevers or flu-like symptoms with a rash. The rash may be red or purple and then turn into blisters or peeling of the skin. Or, you might notice a red rash with swelling of the face, lips or lymph nodes in your neck or under your arms. Avoid drinks that contain alcohol while taking this medication. Drinking large amounts of alcohol, using sleeping or anxiety medications, or quickly stopping the use of these agents while taking this medication may increase your risk for a seizure. Do  not drive or use heavy machinery until you know how this medication affects you. This medication can impair your ability to perform these tasks. Do not take this medication close to bedtime. It may prevent you from sleeping. Your mouth may get dry. Chewing sugarless gum or sucking hard candy, and drinking plenty of water may help. Contact your care team if the problem does not go away or is severe. The tablet shell for some brands of this medication does not dissolve. This is normal. The tablet shell may appear whole in the stool. This is not a cause for concern. What side effects may I notice from receiving this medication? Side effects that you should report to your care team as soon as possible: Allergic reactions--skin rash, itching, hives, swelling of the face, lips, tongue, or throat Increase in blood pressure Mood and behavior changes--anxiety, nervousness, confusion, hallucinations, irritability, hostility, thoughts of suicide or self-harm, worsening mood, feelings of depression Redness, blistering, peeling, or loosening of the skin, including inside the mouth Seizures Sudden eye pain or change in vision such as blurry vision, seeing halos around lights, vision loss Side effects that usually do not require medical attention (report to your care team if they continue or are bothersome): Constipation Dizziness Dry mouth Loss of appetite Nausea Tremors or shaking Trouble sleeping This list may not describe all possible side effects. Call your doctor for medical advice about side effects. You may report side effects to FDA at 1-800-FDA-1088. Where should I keep my medication? Keep out of the reach of children and pets. Store at room temperature between 15 and 30 degrees  C (59 and 86 degrees F). Throw away any unused medication after the expiration date. NOTE: This sheet is a summary. It may not cover all possible information. If you have questions about this medicine, talk to your doctor,  pharmacist, or health care provider.  2023 Elsevier/Gold Standard (2020-03-13 00:00:00)

## 2021-07-31 ENCOUNTER — Ambulatory Visit: Payer: Medicare Other | Admitting: Nurse Practitioner

## 2021-07-31 ENCOUNTER — Ambulatory Visit: Payer: Medicare Other

## 2021-08-02 ENCOUNTER — Other Ambulatory Visit: Payer: Self-pay | Admitting: Internal Medicine

## 2021-08-02 DIAGNOSIS — M25511 Pain in right shoulder: Secondary | ICD-10-CM

## 2021-08-02 DIAGNOSIS — M62838 Other muscle spasm: Secondary | ICD-10-CM

## 2021-08-06 ENCOUNTER — Encounter: Payer: Self-pay | Admitting: *Deleted

## 2021-08-07 DIAGNOSIS — M1612 Unilateral primary osteoarthritis, left hip: Secondary | ICD-10-CM | POA: Diagnosis not present

## 2021-08-08 ENCOUNTER — Encounter: Payer: Self-pay | Admitting: Internal Medicine

## 2021-08-10 ENCOUNTER — Other Ambulatory Visit: Payer: Self-pay | Admitting: Internal Medicine

## 2021-08-10 DIAGNOSIS — G4700 Insomnia, unspecified: Secondary | ICD-10-CM

## 2021-08-11 ENCOUNTER — Encounter: Payer: Self-pay | Admitting: Internal Medicine

## 2021-08-11 DIAGNOSIS — M25552 Pain in left hip: Secondary | ICD-10-CM | POA: Insufficient documentation

## 2021-08-11 DIAGNOSIS — M1712 Unilateral primary osteoarthritis, left knee: Secondary | ICD-10-CM | POA: Insufficient documentation

## 2021-08-11 NOTE — Addendum Note (Signed)
Addended by: Orland Mustard on: 08/11/2021 08:30 PM   Modules accepted: Orders

## 2021-08-12 ENCOUNTER — Encounter: Payer: Self-pay | Admitting: Internal Medicine

## 2021-08-12 DIAGNOSIS — M1612 Unilateral primary osteoarthritis, left hip: Secondary | ICD-10-CM | POA: Diagnosis not present

## 2021-08-12 DIAGNOSIS — Z01818 Encounter for other preprocedural examination: Secondary | ICD-10-CM | POA: Diagnosis not present

## 2021-08-14 ENCOUNTER — Encounter: Payer: Self-pay | Admitting: Internal Medicine

## 2021-08-15 ENCOUNTER — Telehealth: Payer: Self-pay

## 2021-08-15 NOTE — Telephone Encounter (Signed)
Ok to sch with new PCP

## 2021-08-15 NOTE — Telephone Encounter (Signed)
This is fine with me   

## 2021-08-15 NOTE — Telephone Encounter (Signed)
Please inform if ok and pt can get scheduled

## 2021-08-15 NOTE — Telephone Encounter (Signed)
Patient states she would like to transfer her care from Dr. Olivia Mackie McLean-Scocuzza to Dr. Tommi Rumps.

## 2021-09-04 ENCOUNTER — Other Ambulatory Visit: Payer: Self-pay

## 2021-09-04 DIAGNOSIS — R0981 Nasal congestion: Secondary | ICD-10-CM

## 2021-09-04 MED ORDER — FLUTICASONE PROPIONATE 50 MCG/ACT NA SUSP: 2.0000 | Freq: Every day | NASAL | 6 refills | Status: DC

## 2021-09-10 DIAGNOSIS — M1612 Unilateral primary osteoarthritis, left hip: Secondary | ICD-10-CM | POA: Diagnosis not present

## 2021-09-10 DIAGNOSIS — D72829 Elevated white blood cell count, unspecified: Secondary | ICD-10-CM | POA: Diagnosis not present

## 2021-09-10 DIAGNOSIS — E119 Type 2 diabetes mellitus without complications: Secondary | ICD-10-CM | POA: Diagnosis not present

## 2021-09-10 DIAGNOSIS — J45909 Unspecified asthma, uncomplicated: Secondary | ICD-10-CM | POA: Diagnosis not present

## 2021-09-10 DIAGNOSIS — M160 Bilateral primary osteoarthritis of hip: Secondary | ICD-10-CM | POA: Diagnosis not present

## 2021-09-10 DIAGNOSIS — M21752 Unequal limb length (acquired), left femur: Secondary | ICD-10-CM | POA: Diagnosis not present

## 2021-09-10 DIAGNOSIS — R7303 Prediabetes: Secondary | ICD-10-CM | POA: Diagnosis not present

## 2021-09-10 DIAGNOSIS — Z7985 Long-term (current) use of injectable non-insulin antidiabetic drugs: Secondary | ICD-10-CM | POA: Diagnosis not present

## 2021-09-10 DIAGNOSIS — Z91048 Other nonmedicinal substance allergy status: Secondary | ICD-10-CM | POA: Diagnosis not present

## 2021-09-11 DIAGNOSIS — R7303 Prediabetes: Secondary | ICD-10-CM | POA: Diagnosis not present

## 2021-09-11 DIAGNOSIS — Z7985 Long-term (current) use of injectable non-insulin antidiabetic drugs: Secondary | ICD-10-CM | POA: Diagnosis not present

## 2021-09-11 DIAGNOSIS — M160 Bilateral primary osteoarthritis of hip: Secondary | ICD-10-CM | POA: Diagnosis not present

## 2021-09-11 DIAGNOSIS — Z91048 Other nonmedicinal substance allergy status: Secondary | ICD-10-CM | POA: Diagnosis not present

## 2021-09-11 DIAGNOSIS — D72829 Elevated white blood cell count, unspecified: Secondary | ICD-10-CM | POA: Diagnosis not present

## 2021-09-11 DIAGNOSIS — M21752 Unequal limb length (acquired), left femur: Secondary | ICD-10-CM | POA: Diagnosis not present

## 2021-09-11 DIAGNOSIS — J45909 Unspecified asthma, uncomplicated: Secondary | ICD-10-CM | POA: Diagnosis not present

## 2021-09-12 DIAGNOSIS — R262 Difficulty in walking, not elsewhere classified: Secondary | ICD-10-CM | POA: Diagnosis not present

## 2021-09-16 DIAGNOSIS — R262 Difficulty in walking, not elsewhere classified: Secondary | ICD-10-CM | POA: Diagnosis not present

## 2021-09-18 DIAGNOSIS — R262 Difficulty in walking, not elsewhere classified: Secondary | ICD-10-CM | POA: Diagnosis not present

## 2021-09-23 DIAGNOSIS — Z96642 Presence of left artificial hip joint: Secondary | ICD-10-CM | POA: Diagnosis not present

## 2021-09-25 DIAGNOSIS — R262 Difficulty in walking, not elsewhere classified: Secondary | ICD-10-CM | POA: Diagnosis not present

## 2021-10-14 ENCOUNTER — Telehealth: Payer: Self-pay

## 2021-10-14 DIAGNOSIS — Z596 Low income: Secondary | ICD-10-CM

## 2021-10-14 NOTE — Progress Notes (Signed)
[facility])                                            [place] Team    10/14/2021  Charlene Cox [birth year] [id number]                                  Medication Assistance Referral-2023 Re-enrollment  Referral From:  [place]  Medication/Company: Larna Daughters / [employer] Patient application portion:  Education officer, museum portion: Faxed  to Dr. Tommi Rumps Provider address/fax verified via: Office website  [name], [place] [phone]

## 2021-10-16 ENCOUNTER — Telehealth: Payer: Medicare Other | Admitting: Internal Medicine

## 2021-10-16 ENCOUNTER — Encounter: Payer: Self-pay | Admitting: Internal Medicine

## 2021-10-16 VITALS — Ht 69.0 in

## 2021-10-16 DIAGNOSIS — Z23 Encounter for immunization: Secondary | ICD-10-CM

## 2021-10-16 DIAGNOSIS — Z96642 Presence of left artificial hip joint: Secondary | ICD-10-CM | POA: Insufficient documentation

## 2021-10-16 MED ORDER — TETANUS-DIPHTH-ACELL PERTUSSIS 5-2.5-18.5 LF-MCG/0.5 IM SUSP: 0.5000 mL | Freq: Once | INTRAMUSCULAR | 0 refills | Status: AC

## 2021-10-16 MED ORDER — PREVNAR 20 0.5 ML IM SUSY: 0.5000 mL | PREFILLED_SYRINGE | INTRAMUSCULAR | 0 refills | Status: AC

## 2021-10-16 NOTE — Patient Instructions (Addendum)
Your colonoscopy is due again once ready referrals have been placed to   Dr Benson Norway in [place]  MD No Physician   Primary Contact Information  Phone Fax E-mail Address  [phone] Not available [place], [place] [address]     Specialties     Gastroenterology       Or  [facility] Dr. Alice Reichert MD Physician   Primary Contact Information  Phone Fax E-mail Address  [phone] Not available [place] [address]     Specialties     Gastroenterology             Call then when ready either location    Pneumococcal Conjugate Vaccine (Prevnar 20) Suspension for Injection What is this medication? PNEUMOCOCCAL VACCINE (NEU mo KOK al vak SEEN) is a vaccine. It prevents pneumococcus bacterial infections. These bacteria can cause serious infections like pneumonia, meningitis, and blood infections. This vaccine will not treat an infection and will not cause infection. This vaccine is recommended for adults 18 years and older. This medicine may be used for other purposes; ask your health care provider or pharmacist if you have questions. COMMON BRAND NAME(S): Prevnar 20 What should I tell my care team before I take this medication? They need to know if you have any of these conditions: bleeding disorder fever immune system problems an unusual or allergic reaction to pneumococcal vaccine, diphtheria toxoid, other vaccines, other medicines, foods, dyes, or preservatives pregnant or trying to get pregnant breast-feeding How should I use this medication? This vaccine is injected into a muscle. It is given by a health care provider. A copy of Vaccine Information Statements will be given before each vaccination. Be sure to read this information carefully each time. This sheet may change often. Talk to your health care provider about the use of this medicine in children. Special care may be  needed. Overdosage: If you think you have taken too much of this medicine contact a poison control center or emergency room at once. NOTE: This medicine is only for you. Do not share this medicine with others. What if I miss a dose? This does not apply. This medicine is not for regular use. What may interact with this medication? medicines for cancer chemotherapy medicines that suppress your immune function steroid medicines like prednisone or cortisone This list may not describe all possible interactions. Give your health care provider a list of all the medicines, herbs, non-prescription drugs, or dietary supplements you use. Also tell them if you smoke, drink alcohol, or use illegal drugs. Some items may interact with your medicine. What should I watch for while using this medication? Mild fever and pain should go away in 3 days or less. Report any unusual symptoms to your health care provider. What side effects may I notice from receiving this medication? Side effects that you should report to your doctor or health care professional as soon as possible: allergic reactions (skin rash, itching or hives; swelling of the face, lips, or tongue) confusion fast, irregular heartbeat fever over 102 degrees F muscle weakness seizures trouble breathing unusual bruising or bleeding Side effects that usually do not require medical attention (report to your doctor or health care professional if they continue or are bothersome): fever of 102 degrees F or less headache joint pain muscle cramps, pain pain, tender at site where injected This list may not describe all possible side effects. Call your doctor for medical advice  about side effects. You may report side effects to FDA at 1-800-FDA-1088. Where should I keep my medication? This vaccine is only given by a health care provider. It will not be stored at home. NOTE: This sheet is a summary. It may not cover all possible information. If you have  questions about this medicine, talk to your doctor, pharmacist, or health care provider.  2023 Elsevier/Gold Standard (2019-09-01 00:00:00)  Tdap (Tetanus, Diphtheria, Pertussis) Vaccine: What You Need to Know 1. Why get vaccinated? Tdap vaccine can prevent tetanus, diphtheria, and pertussis. Diphtheria and pertussis spread from person to person. Tetanus enters the body through cuts or wounds. TETANUS (T) causes painful stiffening of the muscles. Tetanus can lead to serious health problems, including being unable to open the mouth, having trouble swallowing and breathing, or death. DIPHTHERIA (D) can lead to difficulty breathing, heart failure, paralysis, or death. PERTUSSIS (aP), also known as "whooping cough," can cause uncontrollable, violent coughing that makes it hard to breathe, eat, or drink. Pertussis can be extremely serious especially in babies and young children, causing pneumonia, convulsions, brain damage, or death. In teens and adults, it can cause weight loss, loss of bladder control, passing out, and rib fractures from severe coughing. 2. Tdap vaccine Tdap is only for children 7 years and older, adolescents, and adults.  Adolescents should receive a single dose of Tdap, preferably at age 90 or 47 years. Pregnant people should get a dose of Tdap during every pregnancy, preferably during the early part of the third trimester, to help protect the newborn from pertussis. Infants are most at risk for severe, life-threatening complications from pertussis. Adults who have never received Tdap should get a dose of Tdap. Also, adults should receive a booster dose of either Tdap or Td (a different vaccine that protects against tetanus and diphtheria but not pertussis) every 10 years, or after 5 years in the case of a severe or dirty wound or burn. Tdap may be given at the same time as other vaccines. 3. Talk with your health care provider Tell your vaccine provider if the person getting the  vaccine: Has had an allergic reaction after a previous dose of any vaccine that protects against tetanus, diphtheria, or pertussis, or has any severe, life-threatening allergies Has had a coma, decreased level of consciousness, or prolonged seizures within 7 days after a previous dose of any pertussis vaccine (DTP, DTaP, or Tdap) Has seizures or another nervous system problem Has ever had Guillain-Barr Syndrome (also called "GBS") Has had severe pain or swelling after a previous dose of any vaccine that protects against tetanus or diphtheria In some cases, your health care provider may decide to postpone Tdap vaccination until a future visit. People with minor illnesses, such as a cold, may be vaccinated. People who are moderately or severely ill should usually wait until they recover before getting Tdap vaccine.  Your health care provider can give you more information. 4. Risks of a vaccine reaction Pain, redness, or swelling where the shot was given, mild fever, headache, feeling tired, and nausea, vomiting, diarrhea, or stomachache sometimes happen after Tdap vaccination. People sometimes faint after medical procedures, including vaccination. Tell your provider if you feel dizzy or have vision changes or ringing in the ears.  As with any medicine, there is a very remote chance of a vaccine causing a severe allergic reaction, other serious injury, or death. 5. What if there is a serious problem? An allergic reaction could occur after the vaccinated person leaves  the clinic. If you see signs of a severe allergic reaction (hives, swelling of the face and throat, difficulty breathing, a fast heartbeat, dizziness, or weakness), call 9-1-1 and get the person to the nearest hospital. For other signs that concern you, call your health care provider.  Adverse reactions should be reported to the Vaccine Adverse Event Reporting System (VAERS). Your health care provider will usually file this report, or you  can do it yourself. Visit the VAERS website at www.vaers.SamedayNews.es or call 419-177-6192. VAERS is only for reporting reactions, and VAERS staff members do not give medical advice. 6. The National Vaccine Injury Compensation Program The [employer] Vaccine Injury Compensation Program (VICP) is a federal program that was created to compensate people who may have been injured by certain vaccines. Claims regarding alleged injury or death due to vaccination have a time limit for filing, which may be as short as two years. Visit the VICP website at GoldCloset.com.ee or call 831-479-5544 to learn about the program and about filing a claim. 7. How can I learn more? Ask your health care provider. Call your local or state health department. Visit the website of the Food and Drug Administration (FDA) for vaccine package inserts and additional information at TraderRating.uy. Contact the Centers for Disease Control and Prevention (CDC): Call (847) 277-4194 (1-800-CDC-INFO) or Visit CDC's website at http://hunter.com/. Source: CDC Vaccine Information Statement Tdap (Tetanus, Diphtheria, Pertussis) Vaccine (08/18/2019) This same material is available at http://www.wolf.info/ for no charge. This information is not intended to replace advice given to you by your health care provider. Make sure you discuss any questions you have with your health care provider. Document Revised: 11/27/2020 Document Reviewed: 09/30/2020 Elsevier Patient Education  [place].

## 2021-10-16 NOTE — Progress Notes (Addendum)
Virtual Visit via Video Note  I connected with Charlene Cox  on 10/16/21 at  4:00 PM EDT by a video enabled telemedicine application and verified that I am speaking with the correct person using two identifiers.  Location patient: [place] Location provider:work or home office Persons participating in the virtual visit: patient, provider  I discussed the limitations and requested verbal permission for telemedicine visit. The patient expressed understanding and agreed to proceed.   HPI:  Acute telemedicine visit for : 5 weeks after left hip surgery requiring "grafting" per pt doing well but 50% wt bearing had to stop h/h pt but doing exercises given and will f/u with Dr. Alferd Apa 10/21/21 and PT resume 10/22/21   She wanted this visit to say bye since I am leaving her as PCP    -Pertinent past medical history: see below -Pertinent medication allergies: Allergies  Allergen Reactions   Zanaflex [Tizanidine]     ...after 1 pill of that muscle relaxer about 4 hours later when I got up to pee there was a discomfort it seemed with my urinary track almost like a serious UTI.  I took Tylenol and it began to subside...it had to be my reaction to the muscle relaxer..thanks so much. Teresa   Miralax [Polyethylene Glycol] Other (See Comments)    ?reaction stomach cramp per patient.      -COVID-19 vaccine status:  Immunization History  Administered Date(s) Administered   PFIZER(Purple Top)SARS-COV-2 Vaccination 02/09/2019, 03/09/2019, 11/13/2019   Pfizer Covid-19 Vaccine Bivalent Booster 39yr & up 11/07/2020   Zoster Recombinat (Shingrix) 11/13/2019     ROS: See pertinent positives and negatives per HPI.  Past Medical History:  Diagnosis Date   Allergy    Anxiety    needs with flying, bloodwork   Arthritis    Chronic low back pain    COVID-19    x2   HTN (hypertension) 03/28/2016   Osteoarthritis of left hip    severe determined by x-ray    Past Surgical History:   Procedure Laterality Date   COSMETIC SURGERY  07/2018   tummy tuck and facelift   left total hip Left    08/27/21 [facility] Dr. GRosanna Randy    Current Outpatient Medications:    albuterol (PROAIR HFA) 108 (90 Base) MCG/ACT inhaler, Inhale 1-2 puffs into the lungs every 6 (six) hours as needed for wheezing or shortness of breath., Disp: 18 g, Rfl: 11   ALPRAZolam (XANAX) 0.5 MG tablet, Take 1 tablet (0.5 mg total) by mouth daily as needed for up to 1 dose for anxiety. For flight anxiety., Disp: 30 tablet, Rfl: 0   buPROPion (WELLBUTRIN XL) 150 MG 24 hr tablet, Take 1 tablet (150 mg total) by mouth daily., Disp: 90 tablet, Rfl: 3   cyclobenzaprine (FLEXERIL) 5 MG tablet, TAKE 1 TABLET BY MOUTH AT BEDTIME AS NEEDED FOR MUSCLE SPASMS., Disp: 30 tablet, Rfl: 5   fluticasone (FLONASE) 50 MCG/ACT nasal spray, Place 2 sprays into both nostrils daily., Disp: 16 g, Rfl: 6   montelukast (SINGULAIR) 10 MG tablet, Take 1 tablet (10 mg total) by mouth at bedtime., Disp: 90 tablet, Rfl: 3   Semaglutide,0.25 or 0.'5MG'$ /DOS, (OZEMPIC, 0.25 OR 0.5 MG/DOSE,) 2 MG/1.5ML SOPN, Inject 0.5 mg into the skin once a week., Disp: 1.5 mL, Rfl: 2   traZODone (DESYREL) 50 MG tablet, TAKE 1-2 TABLETS BY MOUTH AT BEDTIME AS NEEDED FOR SLEEP. (Patient not taking: Reported on 10/16/2021), Disp: 180 tablet, Rfl: 2  EXAM:  VITALS  per patient if applicable:  GENERAL: alert, oriented, appears well and in no acute distress  HEENT: atraumatic, conjunttiva clear, no obvious abnormalities on inspection of external nose and ears  NECK: normal movements of the head and neck  LUNGS: on inspection no signs of respiratory distress, breathing rate appears normal, no obvious gross SOB, gasping or wheezing  CV: no obvious cyanosis  MS: moves all visible extremities without noticeable abnormality  PSYCH/NEURO: pleasant and cooperative, no obvious depression or anxiety, speech and thought processing grossly intact  ASSESSMENT AND  PLAN:  Discussed the following assessment and plan:  S/P total left hip arthroplasty  HM Flu shot no declines  Tdap, prevnar 20 consider in the future if not had sent Rx RSV vaccine consider in the future 4/4 pfizer  Shingrix 1/2 11/2019 consider re-vx if not had 2nd shot    Hep C neg    On/gyn Dr. Lance Morin    Mammogram 05/16/21 neg  DEXA 05/16/21 T -1.5 osteopenia    Colonoscopy referred Dr. Benson Norway previously or [facility] GI pt not scheduled as of 10/16/21 they have reached out to pt several times also referred [facility] and pt can call either for screening colonoscopy   Eye referred [facility]    Rec healthy diet and exercise    Other specialists: Established with orth[facility] Dr.Brett gilbert in [facility] TBD eft hip surgery 05/28/21 moved back now   Established with pulmonary Dr. Ander Slade    -we discussed possible serious and likely etiologies, options for evaluation and workup, limitations of telemedicine visit vs in person visit, treatment, treatment risks and precautions. Pt is agreeable to treatment via telemedicine at this moment.   I discussed the assessment and treatment plan with the patient. The patient was provided an opportunity to ask questions and all were answered. The patient agreed with the plan and demonstrated an understanding of the instructions.    Time spent -no charge  Delorise Jackson, MD

## 2021-10-21 DIAGNOSIS — Z96642 Presence of left artificial hip joint: Secondary | ICD-10-CM | POA: Diagnosis not present

## 2021-10-22 DIAGNOSIS — M25552 Pain in left hip: Secondary | ICD-10-CM | POA: Diagnosis not present

## 2021-10-22 DIAGNOSIS — R262 Difficulty in walking, not elsewhere classified: Secondary | ICD-10-CM | POA: Diagnosis not present

## 2021-10-24 DIAGNOSIS — M25552 Pain in left hip: Secondary | ICD-10-CM | POA: Diagnosis not present

## 2021-10-24 DIAGNOSIS — R262 Difficulty in walking, not elsewhere classified: Secondary | ICD-10-CM | POA: Diagnosis not present

## 2021-10-28 DIAGNOSIS — R262 Difficulty in walking, not elsewhere classified: Secondary | ICD-10-CM | POA: Diagnosis not present

## 2021-10-28 DIAGNOSIS — M25552 Pain in left hip: Secondary | ICD-10-CM | POA: Diagnosis not present

## 2021-10-30 ENCOUNTER — Other Ambulatory Visit: Payer: Self-pay

## 2021-10-30 DIAGNOSIS — Z6828 Body mass index (BMI) 28.0-28.9, adult: Secondary | ICD-10-CM

## 2021-10-30 DIAGNOSIS — R262 Difficulty in walking, not elsewhere classified: Secondary | ICD-10-CM | POA: Diagnosis not present

## 2021-10-30 DIAGNOSIS — M25552 Pain in left hip: Secondary | ICD-10-CM | POA: Diagnosis not present

## 2021-10-30 DIAGNOSIS — R7303 Prediabetes: Secondary | ICD-10-CM

## 2021-10-30 MED ORDER — OZEMPIC (0.25 OR 0.5 MG/DOSE) 2 MG/1.5ML ~~LOC~~ SOPN: 0.5000 mg | PEN_INJECTOR | SUBCUTANEOUS | 2 refills | Status: DC

## 2021-11-05 DIAGNOSIS — R262 Difficulty in walking, not elsewhere classified: Secondary | ICD-10-CM | POA: Diagnosis not present

## 2021-11-05 DIAGNOSIS — M25552 Pain in left hip: Secondary | ICD-10-CM | POA: Diagnosis not present

## 2021-11-07 DIAGNOSIS — R262 Difficulty in walking, not elsewhere classified: Secondary | ICD-10-CM | POA: Diagnosis not present

## 2021-11-07 DIAGNOSIS — M25552 Pain in left hip: Secondary | ICD-10-CM | POA: Diagnosis not present

## 2021-11-10 DIAGNOSIS — R262 Difficulty in walking, not elsewhere classified: Secondary | ICD-10-CM | POA: Diagnosis not present

## 2021-11-10 DIAGNOSIS — M25552 Pain in left hip: Secondary | ICD-10-CM | POA: Diagnosis not present

## 2021-11-14 DIAGNOSIS — R262 Difficulty in walking, not elsewhere classified: Secondary | ICD-10-CM | POA: Diagnosis not present

## 2021-11-14 DIAGNOSIS — M25552 Pain in left hip: Secondary | ICD-10-CM | POA: Diagnosis not present

## 2021-11-18 DIAGNOSIS — R262 Difficulty in walking, not elsewhere classified: Secondary | ICD-10-CM | POA: Diagnosis not present

## 2021-11-18 DIAGNOSIS — M25552 Pain in left hip: Secondary | ICD-10-CM | POA: Diagnosis not present

## 2021-11-20 DIAGNOSIS — M25552 Pain in left hip: Secondary | ICD-10-CM | POA: Diagnosis not present

## 2021-11-20 DIAGNOSIS — R262 Difficulty in walking, not elsewhere classified: Secondary | ICD-10-CM | POA: Diagnosis not present

## 2021-11-21 NOTE — Telephone Encounter (Signed)
I called patient to schedule a TOC appointment with Dr. Tommi Rumps, patient's voice mailbox was full.

## 2021-11-25 DIAGNOSIS — R262 Difficulty in walking, not elsewhere classified: Secondary | ICD-10-CM | POA: Diagnosis not present

## 2021-11-25 DIAGNOSIS — M25552 Pain in left hip: Secondary | ICD-10-CM | POA: Diagnosis not present

## 2021-11-27 DIAGNOSIS — R262 Difficulty in walking, not elsewhere classified: Secondary | ICD-10-CM | POA: Diagnosis not present

## 2021-11-27 DIAGNOSIS — M25552 Pain in left hip: Secondary | ICD-10-CM | POA: Diagnosis not present

## 2021-12-09 DIAGNOSIS — R262 Difficulty in walking, not elsewhere classified: Secondary | ICD-10-CM | POA: Diagnosis not present

## 2021-12-09 DIAGNOSIS — M25552 Pain in left hip: Secondary | ICD-10-CM | POA: Diagnosis not present

## 2021-12-11 DIAGNOSIS — R262 Difficulty in walking, not elsewhere classified: Secondary | ICD-10-CM | POA: Diagnosis not present

## 2021-12-11 DIAGNOSIS — M25552 Pain in left hip: Secondary | ICD-10-CM | POA: Diagnosis not present

## 2021-12-17 DIAGNOSIS — M25552 Pain in left hip: Secondary | ICD-10-CM | POA: Diagnosis not present

## 2021-12-17 DIAGNOSIS — R262 Difficulty in walking, not elsewhere classified: Secondary | ICD-10-CM | POA: Diagnosis not present

## 2021-12-18 ENCOUNTER — Telehealth: Payer: Self-pay

## 2021-12-18 DIAGNOSIS — Z596 Low income: Secondary | ICD-10-CM

## 2021-12-18 NOTE — Progress Notes (Signed)
[place] [facility])                                            [place] Team    12/18/2021  Kersten Salmons [birth year] [id number]    [place] [facility])                                            [place] Team   Received Patient and Provider portion(s) of patient assistance application(s) for Ozempic. Faxed completed application and required documents into [employer].  [name], [facility] [phone]

## 2021-12-19 DIAGNOSIS — R262 Difficulty in walking, not elsewhere classified: Secondary | ICD-10-CM | POA: Diagnosis not present

## 2021-12-19 DIAGNOSIS — M25552 Pain in left hip: Secondary | ICD-10-CM | POA: Diagnosis not present

## 2021-12-23 DIAGNOSIS — R262 Difficulty in walking, not elsewhere classified: Secondary | ICD-10-CM | POA: Diagnosis not present

## 2021-12-23 DIAGNOSIS — M25552 Pain in left hip: Secondary | ICD-10-CM | POA: Diagnosis not present

## 2021-12-23 DIAGNOSIS — Z96642 Presence of left artificial hip joint: Secondary | ICD-10-CM | POA: Diagnosis not present

## 2021-12-25 DIAGNOSIS — R262 Difficulty in walking, not elsewhere classified: Secondary | ICD-10-CM | POA: Diagnosis not present

## 2021-12-25 DIAGNOSIS — M25552 Pain in left hip: Secondary | ICD-10-CM | POA: Diagnosis not present

## 2021-12-31 DIAGNOSIS — R262 Difficulty in walking, not elsewhere classified: Secondary | ICD-10-CM | POA: Diagnosis not present

## 2021-12-31 DIAGNOSIS — M25552 Pain in left hip: Secondary | ICD-10-CM | POA: Diagnosis not present

## 2022-01-07 DIAGNOSIS — R051 Acute cough: Secondary | ICD-10-CM | POA: Diagnosis not present

## 2022-01-08 ENCOUNTER — Other Ambulatory Visit (HOSPITAL_COMMUNITY): Payer: Self-pay

## 2022-01-09 ENCOUNTER — Other Ambulatory Visit (HOSPITAL_COMMUNITY): Payer: Self-pay

## 2022-01-15 ENCOUNTER — Telehealth: Payer: Self-pay

## 2022-01-15 DIAGNOSIS — Z596 Low income: Secondary | ICD-10-CM

## 2022-01-15 DIAGNOSIS — R262 Difficulty in walking, not elsewhere classified: Secondary | ICD-10-CM | POA: Diagnosis not present

## 2022-01-15 DIAGNOSIS — M25552 Pain in left hip: Secondary | ICD-10-CM | POA: Diagnosis not present

## 2022-01-15 NOTE — Progress Notes (Signed)
[facility])                                            [place] Team    01/15/2022  Roger Fasnacht July 03, 1955 [id number]  Care Coordination call placed to [place] in regards to [place].  Spoke with Calette who informs patient is approved 01/12/2002-01/12/2003. A 120 day supply of medication will be sent to the doctors office. Patient is informed of the approval.  [name], [facility] [phone]

## 2022-01-23 DIAGNOSIS — M25552 Pain in left hip: Secondary | ICD-10-CM | POA: Diagnosis not present

## 2022-01-23 DIAGNOSIS — R262 Difficulty in walking, not elsewhere classified: Secondary | ICD-10-CM | POA: Diagnosis not present

## 2022-01-28 DIAGNOSIS — R262 Difficulty in walking, not elsewhere classified: Secondary | ICD-10-CM | POA: Diagnosis not present

## 2022-01-28 DIAGNOSIS — M25552 Pain in left hip: Secondary | ICD-10-CM | POA: Diagnosis not present

## 2022-01-30 ENCOUNTER — Encounter: Payer: Self-pay | Admitting: Family Medicine

## 2022-01-30 ENCOUNTER — Ambulatory Visit (INDEPENDENT_AMBULATORY_CARE_PROVIDER_SITE_OTHER): Payer: Medicare Other | Admitting: Family Medicine

## 2022-01-30 VITALS — BP 120/80 | HR 77 | Temp 99.3°F | Ht 66.0 in | Wt 162.3 lb

## 2022-01-30 DIAGNOSIS — E663 Overweight: Secondary | ICD-10-CM

## 2022-01-30 DIAGNOSIS — H9313 Tinnitus, bilateral: Secondary | ICD-10-CM | POA: Diagnosis not present

## 2022-01-30 DIAGNOSIS — F338 Other recurrent depressive disorders: Secondary | ICD-10-CM

## 2022-01-30 DIAGNOSIS — F419 Anxiety disorder, unspecified: Secondary | ICD-10-CM | POA: Diagnosis not present

## 2022-01-30 DIAGNOSIS — D229 Melanocytic nevi, unspecified: Secondary | ICD-10-CM | POA: Diagnosis not present

## 2022-01-30 DIAGNOSIS — H9319 Tinnitus, unspecified ear: Secondary | ICD-10-CM | POA: Insufficient documentation

## 2022-01-30 DIAGNOSIS — R41840 Attention and concentration deficit: Secondary | ICD-10-CM

## 2022-01-30 MED ORDER — ALPRAZOLAM 0.5 MG PO TABS: 0.5000 mg | ORAL_TABLET | Freq: Every day | ORAL | 0 refills | Status: DC | PRN

## 2022-01-30 MED ORDER — BUPROPION HCL ER (XL) 300 MG PO TB24: 300.0000 mg | ORAL_TABLET | Freq: Every day | ORAL | 3 refills | Status: DC

## 2022-01-30 NOTE — Assessment & Plan Note (Signed)
Chronic issue.  Patient has some anxiety and depression.  We will increase her Wellbutrin to 300 mg daily.  Discussed if her anxiety worsens with this increase she needs to reduce the dose back to 150 mg daily and let me know.  I will refill her Xanax to use as needed for situational anxiety.

## 2022-01-30 NOTE — Assessment & Plan Note (Signed)
Weight has trended down with Ozempic.  Discussed risk of pancreatitis, gallbladder issues, and medullary thyroid cancer.  Discussed medullary thyroid cancer has been seen in rats studies though this has not seemed to translate into humans that we have seen yet.  She noted no personal or family history of thyroid cancer, parathyroid cancer, or adrenal gland cancer.

## 2022-01-30 NOTE — Assessment & Plan Note (Signed)
Chronic issue.  Bilateral.  Suspect related to prior noise exposure and possibly some mild hearing loss.  Discussed if this worsens or if she develops unilateral symptoms she needs to let us know.

## 2022-01-30 NOTE — Assessment & Plan Note (Signed)
Chronic issue.  Benign-appearing.  I will have her see dermatology to evaluate this as well.

## 2022-01-30 NOTE — Progress Notes (Signed)
Charlene Rumps, MD Phone: [phone]  Charlene Cox is a 67 y.o. female who presents today for f/u.  Anxiety/depression: Patient notes she does have some stuff going on in her family that stressful.  She has anxiety and depression though no SI.  She is on Wellbutrin 1 tablet daily.  She only takes Xanax situationally.  Prediabetes/overweight: Patient has been on Ozempic and has not had any side effects.  Nevus: Patient reports there is a nevus near her right jawline.  Her aesthetician thought it may be needed to be looked at by dermatology.  It may be has gotten darker though has not changed in size over the last several years.  Tinnitus: This is a chronic issue going on 3 to 4 years.  She maybe has a little change to her hearing though nothing significant.  She has no unilateral symptoms.  Social History   Tobacco Use  Smoking Status Never  Smokeless Tobacco Never    Current Outpatient Medications on File Prior to Visit  Medication Sig Dispense Refill   albuterol (PROAIR HFA) 108 (90 Base) MCG/ACT inhaler Inhale 1-2 puffs into the lungs every 6 (six) hours as needed for wheezing or shortness of breath. 18 g 11   cyclobenzaprine (FLEXERIL) 5 MG tablet TAKE 1 TABLET BY MOUTH AT BEDTIME AS NEEDED FOR MUSCLE SPASMS. 30 tablet 5   fluticasone (FLONASE) 50 MCG/ACT nasal spray Place 2 sprays into both nostrils daily. 16 g 6   montelukast (SINGULAIR) 10 MG tablet Take 1 tablet (10 mg total) by mouth at bedtime. 90 tablet 3   Semaglutide,0.25 or 0.'5MG'$ /DOS, (OZEMPIC, 0.25 OR 0.5 MG/DOSE,) 2 MG/1.5ML SOPN Inject 0.5 mg into the skin once a week. 1.5 mL 2   traZODone (DESYREL) 50 MG tablet TAKE 1-2 TABLETS BY MOUTH AT BEDTIME AS NEEDED FOR SLEEP. 180 tablet 2   No current facility-administered medications on file prior to visit.     ROS see history of present illness  Objective  Physical Exam Vitals:   01/30/22 1357  BP: 120/80  Pulse: 77  Temp: 99.3 F (37.4 C)  SpO2: 98%     BP Readings from Last 3 Encounters:  01/30/22 120/80  07/01/21 98/62  05/08/21 124/70   Wt Readings from Last 3 Encounters:  01/30/22 162 lb 4.8 oz (73.6 kg)  07/17/21 172 lb (78 kg)  07/01/21 172 lb (78 kg)    Physical Exam Constitutional:      General: She is not in acute distress.    Appearance: She is not diaphoretic.  HENT:     Right Ear: Tympanic membrane normal.     Left Ear: Tympanic membrane normal.  Neck:   Cardiovascular:     Rate and Rhythm: Normal rate and regular rhythm.     Heart sounds: Normal heart sounds.  Pulmonary:     Effort: Pulmonary effort is normal.     Breath sounds: Normal breath sounds.  Skin:    General: Skin is warm and dry.  Neurological:     Mental Status: She is alert.      Assessment/Plan: Please see individual problem list.  Anxiety Assessment & Plan: Chronic issue.  Patient has some anxiety and depression.  We will increase her Wellbutrin to 300 mg daily.  Discussed if her anxiety worsens with this increase she needs to reduce the dose back to 150 mg daily and let me know.  I will refill her Xanax to use as needed for situational anxiety.  Orders: -  ALPRAZolam; Take 1 tablet (0.5 mg total) by mouth daily as needed for anxiety. For flight anxiety.  Dispense: 30 tablet; Refill: 0  Nevus Assessment & Plan: Chronic issue.  Benign-appearing.  I will have her see dermatology to evaluate this as well.  Orders: -     Ambulatory referral to Dermatology  Overweight Assessment & Plan: Weight has trended down with Ozempic.  Discussed risk of pancreatitis, gallbladder issues, and medullary thyroid cancer.  Discussed medullary thyroid cancer has been seen in rats studies though this has not seemed to translate into humans that we have seen yet.  She noted no personal or family history of thyroid cancer, parathyroid cancer, or adrenal gland cancer.   Tinnitus of both ears Assessment & Plan: Chronic issue.  Bilateral.  Suspect  related to prior noise exposure and possibly some mild hearing loss.  Discussed if this worsens or if she develops unilateral symptoms she needs to let us know.   Seasonal affective disorder (HCC) -     buPROPion HCl ER (XL); Take 1 tablet (300 mg total) by mouth daily.  Dispense: 90 tablet; Refill: 3  Attention and concentration deficit -     buPROPion HCl ER (XL); Take 1 tablet (300 mg total) by mouth daily.  Dispense: 90 tablet; Refill: 3     Return in about 3 months (around 05/01/2022) for anxiety/depression follow-up.   Charlene Rumps, MD [place]

## 2022-02-02 DIAGNOSIS — M25552 Pain in left hip: Secondary | ICD-10-CM | POA: Diagnosis not present

## 2022-02-02 DIAGNOSIS — R262 Difficulty in walking, not elsewhere classified: Secondary | ICD-10-CM | POA: Diagnosis not present

## 2022-02-25 ENCOUNTER — Telehealth: Payer: Self-pay | Admitting: *Deleted

## 2022-02-25 NOTE — Telephone Encounter (Signed)
Patient notified that Patient Assistance Medication are in the office & are ready for pick up.   Medication: Ozempic 60m/3mL (5 boxes)   Lot# [id number]: 09/12/2023

## 2022-03-02 ENCOUNTER — Encounter: Payer: Self-pay | Admitting: Family Medicine

## 2022-03-02 DIAGNOSIS — F338 Other recurrent depressive disorders: Secondary | ICD-10-CM

## 2022-03-02 DIAGNOSIS — R41840 Attention and concentration deficit: Secondary | ICD-10-CM

## 2022-03-02 MED ORDER — BUPROPION HCL ER (XL) 150 MG PO TB24: 150.0000 mg | ORAL_TABLET | Freq: Every day | ORAL | 1 refills | Status: DC

## 2022-03-02 NOTE — Telephone Encounter (Signed)
Pt picked up medication.

## 2022-03-10 ENCOUNTER — Other Ambulatory Visit: Payer: Self-pay | Admitting: Family Medicine

## 2022-03-10 DIAGNOSIS — R0981 Nasal congestion: Secondary | ICD-10-CM

## 2022-04-27 DIAGNOSIS — L298 Other pruritus: Secondary | ICD-10-CM | POA: Diagnosis not present

## 2022-04-27 DIAGNOSIS — L821 Other seborrheic keratosis: Secondary | ICD-10-CM | POA: Diagnosis not present

## 2022-04-28 ENCOUNTER — Telehealth: Payer: Self-pay | Admitting: *Deleted

## 2022-04-30 ENCOUNTER — Encounter: Payer: Self-pay | Admitting: Pharmacist

## 2022-04-30 DIAGNOSIS — R7303 Prediabetes: Secondary | ICD-10-CM

## 2022-04-30 NOTE — Progress Notes (Signed)
[facility]) [facility] Team Statin Quality Measure Assessment  04/30/2022  Charlene Cox Dec 05, 1955 [id number]  Per review of chart and payor information, patient is taking a medication used to treat  diabetes but is not currently filling a statin prescription.  This places patient into the Statin Use In Patients with Diabetes (SUPD) measure for CMS.    Patient has an upcoming appointment 05/01/2022. She also has a diagnosis of diabetes on her problem list.  If deemed therapeutically appropriate, patient should be evaluated for statin therapy.  If she has experienced statin intolerance, a statin exclusion code could be associated with the upcoming visit as well as the code for Prediabetes (R73.03) which would remove the patient from the measure.    The 10-year ASCVD risk score (Arnett DK, et al., 2019) is: 21.1%   Values used to calculate the score:     Age: 67 years     Sex: Female     Is Non-Hispanic African American: Yes     Diabetic: Yes     Tobacco smoker: No     Systolic Blood Pressure: 120 mmHg     Is BP treated: No     HDL Cholesterol: 46.9 mg/dL     Total Cholesterol: 197 mg/dL 4/0/9811     Component Value Date/Time   CHOL 197 01/16/2021 1507   TRIG 178.0 (H) 01/16/2021 1507   HDL 46.90 01/16/2021 1507   CHOLHDL 4 01/16/2021 1507   VLDL 35.6 01/16/2021 1507   LDLCALC 114 (H) 01/16/2021 1507    Please consider ONE of the following recommendations:  Initiate high intensity statin Atorvastatin 40 mg once daily, #90, 3 refills   Rosuvastatin 20 mg once daily, #90, 3 refills    Initiate moderate intensity          statin with reduced frequency if prior          statin intolerance 1x weekly, #13, 3 refills   2x weekly, #26, 3 refills   3x weekly, #39, 3 refills    Code for past statin intolerance or  other exclusions (required annually)  Provider Requirements: Associate code during an office visit or telehealth encounter  Drug Induced  Myopathy G72.0   Myopathy, unspecified G72.9   Myositis, unspecified M60.9   Rhabdomyolysis M62.82   Cirrhosis of liver K74.69   Prediabetes R73.03   PCOS E28.2   Thank you for allowing [facility] pharmacy to be a part of this patient's care.  ADDENDUM Patient no showed appt--rescheudled=================================================================================================================================================

## 2022-05-01 ENCOUNTER — Ambulatory Visit: Payer: Medicare Other | Admitting: Family Medicine

## 2022-05-27 ENCOUNTER — Ambulatory Visit (INDEPENDENT_AMBULATORY_CARE_PROVIDER_SITE_OTHER): Payer: Medicare Other | Admitting: Family Medicine

## 2022-05-27 ENCOUNTER — Encounter: Payer: Self-pay | Admitting: Family Medicine

## 2022-05-27 VITALS — BP 114/72 | HR 68 | Temp 97.9°F | Ht 66.0 in | Wt 169.4 lb

## 2022-05-27 DIAGNOSIS — R7303 Prediabetes: Secondary | ICD-10-CM

## 2022-05-27 DIAGNOSIS — F419 Anxiety disorder, unspecified: Secondary | ICD-10-CM

## 2022-05-27 DIAGNOSIS — Z1211 Encounter for screening for malignant neoplasm of colon: Secondary | ICD-10-CM

## 2022-05-27 NOTE — Assessment & Plan Note (Signed)
Chronic issue.  Seems to be adequately controlled per her report.  She will continue Wellbutrin 150 mg daily she will monitor for any worsening symptoms.

## 2022-05-27 NOTE — Assessment & Plan Note (Signed)
Chronic issue.  Patient can continue Ozempic 0.5 mg weekly.  Will check A1c today.  I encouraged her to check with her orthopedist to see what she can do for exercise and then start doing what ever they advised.  Discussed healthier diet.

## 2022-05-27 NOTE — Progress Notes (Signed)
Marikay Alar, MD Phone: [phone]  Charlene Cox is a 67 y.o. female who presents today for follow-up.  Anxiety/depression: Patient notes this is good.  She notes no significant anxiety or depression.  She is on Wellbutrin 150 mg daily.  No SI.  Prediabetes: Patient has been on Ozempic for this.  She has been eating what she wants.  She had a hip replacement and is hesitant to exercise until she speaks with the orthopedist to see what she can do.  Social History   Tobacco Use  Smoking Status Never  Smokeless Tobacco Never    Current Outpatient Medications on File Prior to Visit  Medication Sig Dispense Refill   albuterol (PROAIR HFA) 108 (90 Base) MCG/ACT inhaler Inhale 1-2 puffs into the lungs every 6 (six) hours as needed for wheezing or shortness of breath. 18 g 11   ALPRAZolam (XANAX) 0.5 MG tablet Take 1 tablet (0.5 mg total) by mouth daily as needed for anxiety. For flight anxiety. 30 tablet 0   buPROPion (WELLBUTRIN XL) 150 MG 24 hr tablet Take 1 tablet (150 mg total) by mouth daily. 90 tablet 1   cyclobenzaprine (FLEXERIL) 5 MG tablet TAKE 1 TABLET BY MOUTH AT BEDTIME AS NEEDED FOR MUSCLE SPASMS. 30 tablet 5   fluticasone (FLONASE) 50 MCG/ACT nasal spray SPRAY 2 SPRAYS INTO EACH NOSTRIL EVERY DAY 48 mL 2   montelukast (SINGULAIR) 10 MG tablet Take 1 tablet (10 mg total) by mouth at bedtime. 90 tablet 3   Semaglutide,0.25 or 0.5MG /DOS, (OZEMPIC, 0.25 OR 0.5 MG/DOSE,) 2 MG/1.5ML SOPN Inject 0.5 mg into the skin once a week. 1.5 mL 2   traZODone (DESYREL) 50 MG tablet TAKE 1-2 TABLETS BY MOUTH AT BEDTIME AS NEEDED FOR SLEEP. 180 tablet 2   No current facility-administered medications on file prior to visit.     ROS see history of present illness  Objective  Physical Exam Vitals:   05/27/22 1601  BP: 114/72  Pulse: 68  Temp: 97.9 F (36.6 C)  SpO2: 99%    BP Readings from Last 3 Encounters:  05/27/22 114/72  01/30/22 120/80  07/01/21 98/62   Wt  Readings from Last 3 Encounters:  05/27/22 169 lb 6.4 oz (76.8 kg)  01/30/22 162 lb 4.8 oz (73.6 kg)  07/17/21 172 lb (78 kg)    Physical Exam Constitutional:      General: She is not in acute distress.    Appearance: She is not diaphoretic.  Cardiovascular:     Rate and Rhythm: Normal rate and regular rhythm.     Heart sounds: Normal heart sounds.  Pulmonary:     Effort: Pulmonary effort is normal.     Breath sounds: Normal breath sounds.  Skin:    General: Skin is warm and dry.  Neurological:     Mental Status: She is alert.      Assessment/Plan: Please see individual problem list.  Anxiety Assessment & Plan: Chronic issue.  Seems to be adequately controlled per her report.  She will continue Wellbutrin 150 mg daily she will monitor for any worsening symptoms.   Prediabetes Assessment & Plan: Chronic issue.  Patient can continue Ozempic 0.5 mg weekly.  Will check A1c today.  I encouraged her to check with her orthopedist to see what she can do for exercise and then start doing what ever they advised.  Discussed healthier diet.  Orders: -     Hemoglobin A1c -     Comprehensive metabolic panel -  Lipid panel  Colon cancer screening -     Ambulatory referral to Gastroenterology     Health Maintenance: refer to GI for colonoscopy.  Return in about 6 months (around 11/27/2022).   Marikay Alar, MD [facility]

## 2022-05-28 LAB — LIPID PANEL
Cholesterol: 204 mg/dL — ABNORMAL HIGH (ref 0–200)
HDL: 52.4 mg/dL (ref >39.00)
NonHDL: 151.51
Total CHOL/HDL Ratio: 4
Triglycerides: 203 mg/dL — ABNORMAL HIGH (ref 0.0–149.0)
VLDL: 40.6 mg/dL — ABNORMAL HIGH (ref 0.0–40.0)

## 2022-05-28 LAB — COMPREHENSIVE METABOLIC PANEL
ALT: 11 U/L (ref 0–35)
AST: 13 U/L (ref 0–37)
Albumin: 4.4 g/dL (ref 3.5–5.2)
Alkaline Phosphatase: 60 U/L (ref 39–117)
BUN: 13 mg/dL (ref 6–23)
CO2: 31 mEq/L (ref 19–32)
Calcium: 9.6 mg/dL (ref 8.4–10.5)
Chloride: 101 mEq/L (ref 96–112)
Creatinine, Ser: 0.95 mg/dL (ref 0.40–1.20)
GFR: 61.23 mL/min (ref >60.00)
Glucose, Bld: 89 mg/dL (ref 70–99)
Potassium: 4.2 mEq/L (ref 3.5–5.1)
Sodium: 142 mEq/L (ref 135–145)
Total Bilirubin: 0.5 mg/dL (ref 0.2–1.2)
Total Protein: 6.6 g/dL (ref 6.0–8.3)

## 2022-05-28 LAB — LDL CHOLESTEROL, DIRECT: Direct LDL: 132 mg/dL

## 2022-05-28 LAB — HEMOGLOBIN A1C: Hgb A1c MFr Bld: 5.5 % (ref 4.6–6.5)

## 2022-06-01 ENCOUNTER — Telehealth: Payer: Self-pay

## 2022-06-01 NOTE — Telephone Encounter (Signed)
-----   Message from Maretta Bees, [place] sent at 05/28/2022  8:00 PM EDT ----- Was this patient fasting? If so, Tgs are elevated above goal. Dietary ways to cut back on Tgs include decreasing saturated fat intake and alcohol. She can start taking metamucil daily which can help lower Tgs - 1 rounded teaspoon 1-3 x/ day.    ----- Message ----- From: Interface, [facility] Sent: 05/28/2022  10:11 AM EDT To: Glori Luis, MD

## 2022-06-01 NOTE — Telephone Encounter (Signed)
Attempted to call Patient 

## 2022-06-03 ENCOUNTER — Telehealth: Payer: Self-pay

## 2022-06-03 NOTE — Telephone Encounter (Signed)
Called Patient to let her know that we received 5 boxes of Ozempic Patient Assistance medications for her that is ready for pick up.

## 2022-06-13 HISTORY — PX: EYE SURGERY: SHX253

## 2022-06-16 ENCOUNTER — Other Ambulatory Visit: Payer: Self-pay | Admitting: Family Medicine

## 2022-06-16 DIAGNOSIS — Z1231 Encounter for screening mammogram for malignant neoplasm of breast: Secondary | ICD-10-CM

## 2022-06-22 ENCOUNTER — Ambulatory Visit: Payer: Medicare Other | Admitting: Family Medicine

## 2022-06-30 ENCOUNTER — Telehealth: Payer: Self-pay | Admitting: Family Medicine

## 2022-06-30 NOTE — Telephone Encounter (Signed)
Copied from CRM [id number]. Topic: Medicare AWV >> Jun 30, 2022 10:22 AM Payton Doughty wrote: Reason for CRM: LM 06/30/2022 to schedule AWV   Verlee Rossetti; Care Guide Ambulatory Clinical Support [place] [facility] Direct Dial: [phone]

## 2022-07-03 ENCOUNTER — Ambulatory Visit: Payer: Medicare Other

## 2022-07-03 DIAGNOSIS — H2512 Age-related nuclear cataract, left eye: Secondary | ICD-10-CM | POA: Diagnosis not present

## 2022-07-03 DIAGNOSIS — H43811 Vitreous degeneration, right eye: Secondary | ICD-10-CM | POA: Diagnosis not present

## 2022-07-03 DIAGNOSIS — H52203 Unspecified astigmatism, bilateral: Secondary | ICD-10-CM | POA: Diagnosis not present

## 2022-07-24 ENCOUNTER — Ambulatory Visit
Admission: RE | Admit: 2022-07-24 | Discharge: 2022-07-24 | Disposition: A | Discharge status: Home / Self Care | Payer: Medicare Other | Source: Ambulatory Visit | Attending: Family Medicine | Admitting: Family Medicine

## 2022-07-24 DIAGNOSIS — Z1231 Encounter for screening mammogram for malignant neoplasm of breast: Secondary | ICD-10-CM

## 2022-07-29 ENCOUNTER — Ambulatory Visit: Payer: Medicare Other

## 2022-09-08 ENCOUNTER — Telehealth: Payer: Self-pay

## 2022-09-08 NOTE — Telephone Encounter (Signed)
Received 5 boxes of Ozempic 0.25,0.5 mg 1x3 prefilled pens from Patient Assistance. Patient is aware to come pick up.

## 2022-09-08 NOTE — Telephone Encounter (Signed)
error 

## 2022-09-08 NOTE — Telephone Encounter (Signed)
Pts husband came in to pick up the Ozempic pts husband was handed the 5 boxes of ozempic

## 2022-09-22 ENCOUNTER — Other Ambulatory Visit: Payer: Self-pay | Admitting: Family Medicine

## 2022-09-22 DIAGNOSIS — F338 Other recurrent depressive disorders: Secondary | ICD-10-CM

## 2022-09-22 DIAGNOSIS — R41840 Attention and concentration deficit: Secondary | ICD-10-CM

## 2022-09-23 ENCOUNTER — Ambulatory Visit (INDEPENDENT_AMBULATORY_CARE_PROVIDER_SITE_OTHER): Payer: Medicare Other | Admitting: *Deleted

## 2022-09-23 ENCOUNTER — Encounter: Payer: Self-pay | Admitting: Family Medicine

## 2022-09-23 ENCOUNTER — Other Ambulatory Visit (HOSPITAL_COMMUNITY): Payer: Self-pay

## 2022-09-23 VITALS — Ht 66.0 in | Wt 169.0 lb

## 2022-09-23 DIAGNOSIS — Z0000 Encounter for general adult medical examination without abnormal findings: Secondary | ICD-10-CM

## 2022-09-23 NOTE — Patient Instructions (Signed)
Charlene Cox , Thank you for taking time to come for your Medicare Wellness Visit. I appreciate your ongoing commitment to your health goals. Please review the following plan we discussed and let me know if I can assist you in the future.   Referrals/Orders/Follow-Ups/Clinician Recommendations: None  This is a list of the screening recommended for you and due dates:  Health Maintenance  Topic Date Due   DTaP/Tdap/Td vaccine (1 - Tdap) Never done   Colon Cancer Screening  Never done   Pneumonia Vaccine (1 of 1 - PCV) Never done   Zoster (Shingles) Vaccine (2 of 2) 01/08/2020   Flu Shot  Never done   COVID-19 Vaccine (5 - 2023-24 season) 09/13/2022   Medicare Annual Wellness Visit  09/23/2023   Mammogram  07/23/2024   DEXA scan (bone density measurement)  Completed   Hepatitis C Screening  Completed   HPV Vaccine  Aged Out    Advanced directives: (Declined) Advance directive discussed with you today. Even though you declined this today, please call our office should you change your mind, and we can give you the proper paperwork for you to fill out.  Next Medicare Annual Wellness Visit scheduled for next year: Yes 09/29/23 @11 :00

## 2022-09-23 NOTE — Progress Notes (Signed)
Subjective:   Charlene Cox is a 67 y.o. female who presents for Medicare Annual (Subsequent) preventive examination.  Visit Complete: Virtual  I connected with  Charlene Cox on 09/23/22 by a audio enabled telemedicine application and verified that I am speaking with the correct person using two identifiers.  Patient Location: Home  Provider Location: Office/Clinic  I discussed the limitations of evaluation and management by telemedicine. The patient expressed understanding and agreed to proceed.  Vital Signs: Unable to obtain new vitals due to this being a telehealth visit.   Review of Systems     Cardiac Risk Factors include: advanced age (>55men, >38 women);dyslipidemia;hypertension     Objective:    Today's Vitals   09/23/22 1123  Weight: 169 lb (76.7 kg)  Height: 5\' 6"  (1.676 m)   Body mass index is 27.28 kg/m.     09/23/2022   11:39 AM 07/17/2021    1:31 PM 07/01/2021    2:03 PM 07/01/2021    1:30 PM 06/27/2020   12:09 PM 01/19/2019    2:28 PM  Advanced Directives  Does Patient Have a Medical Advance Directive? No No No No No No  Does patient want to make changes to medical advance directive?  No - Patient declined Yes (MAU/Ambulatory/Procedural Areas - Information given)     Would patient like information on creating a medical advance directive? No - Patient declined  Yes (MAU/Ambulatory/Procedural Areas - Information given)   Yes (MAU/Ambulatory/Procedural Areas - Information given)    Current Medications (verified) Outpatient Encounter Medications as of 09/23/2022  Medication Sig   albuterol (PROAIR HFA) 108 (90 Base) MCG/ACT inhaler Inhale 1-2 puffs into the lungs every 6 (six) hours as needed for wheezing or shortness of breath.   ALPRAZolam (XANAX) 0.5 MG tablet Take 1 tablet (0.5 mg total) by mouth daily as needed for anxiety. For flight anxiety.   buPROPion (WELLBUTRIN XL) 150 MG 24 hr tablet Take 1 tablet (150 mg total) by mouth daily.    cyclobenzaprine (FLEXERIL) 5 MG tablet TAKE 1 TABLET BY MOUTH AT BEDTIME AS NEEDED FOR MUSCLE SPASMS.   fluticasone (FLONASE) 50 MCG/ACT nasal spray SPRAY 2 SPRAYS INTO EACH NOSTRIL EVERY DAY   montelukast (SINGULAIR) 10 MG tablet Take 1 tablet (10 mg total) by mouth at bedtime.   Semaglutide,0.25 or 0.5MG /DOS, (OZEMPIC, 0.25 OR 0.5 MG/DOSE,) 2 MG/1.5ML SOPN Inject 0.5 mg into the skin once a week.   traZODone (DESYREL) 50 MG tablet TAKE 1-2 TABLETS BY MOUTH AT BEDTIME AS NEEDED FOR SLEEP.   No facility-administered encounter medications on file as of 09/23/2022.    Allergies (verified) Zanaflex [tizanidine] and Miralax [polyethylene glycol]   History: Past Medical History:  Diagnosis Date   Allergy    Anxiety    needs with flying, bloodwork   Arthritis    Chronic low back pain    COVID-19    x2   HTN (hypertension) 03/28/2016   Osteoarthritis of left hip    severe determined by x-ray   Past Surgical History:  Procedure Laterality Date   COSMETIC SURGERY  07/2018   tummy tuck and facelift   EYE SURGERY Right 06/2022   left total hip Left    08/27/21 [facility] Dr. Sullivan Lone   History reviewed. No pertinent family history. Social History   Socioeconomic History   Marital status: Married    Spouse name: Not on file   Number of children: Not on file   Years of education: Not on file  Highest education level: Not on file  Occupational History   Occupation: semi retired  Tobacco Use   Smoking status: Never   Smokeless tobacco: Never  Vaping Use   Vaping status: Never Used  Substance and Sexual Activity   Alcohol use: No    Alcohol/week: 0.0 standard drinks of alcohol   Drug use: No   Sexual activity: Not Currently  Other Topics Concern   Not on file  Social History Narrative   Married   Social Determinants of Health   Financial Resource Strain: Low Risk  (09/23/2022)   Overall Financial Resource Strain (CARDIA)    Difficulty of Paying Living Expenses: Not hard  at all  Food Insecurity: No Food Insecurity (09/23/2022)   Hunger Vital Sign    Worried About Running Out of Food in the Last Year: Never true    Ran Out of Food in the Last Year: Never true  Transportation Needs: No Transportation Needs (09/23/2022)   PRAPARE - Administrator, Civil Service (Medical): No    Lack of Transportation (Non-Medical): No  Physical Activity: Inactive (09/23/2022)   Exercise Vital Sign    Days of Exercise per Week: 0 days    Minutes of Exercise per Session: 0 min  Stress: No Stress Concern Present (09/23/2022)   [employer] of Occupational Health - Occupational Stress Questionnaire    Feeling of Stress : Only a little  Social Connections: Socially Integrated (09/23/2022)   Social Connection and Isolation Panel [NHANES]    Frequency of Communication with Friends and Family: More than three times a week    Frequency of Social Gatherings with Friends and Family: More than three times a week    Attends Religious Services: More than 4 times per year    Active Member of [employer] or Organizations: Yes    Attends Engineer, structural: More than 4 times per year    Marital Status: Married    Tobacco Counseling Counseling given: Not Answered   Clinical Intake:  Pre-visit preparation completed: Yes  Pain : No/denies pain     BMI - recorded: 27.28 Nutritional Status: BMI 25 -29 Overweight Nutritional Risks: None Diabetes: No  How often do you need to have someone help you when you read instructions, pamphlets, or other written materials from your doctor or pharmacy?: 1 - Never  Interpreter Needed?: No  Information entered by :: R. Mariel Lukins LPN   Activities of Daily Living    09/23/2022   11:25 AM  In your present state of health, do you have any difficulty performing the following activities:  Hearing? 0  Vision? 0  Difficulty concentrating or making decisions? 0  Walking or climbing stairs? 0  Dressing or bathing? 0  Doing  errands, shopping? 0  Preparing Food and eating ? N  Using the Toilet? N  In the past six months, have you accidently leaked urine? N  Do you have problems with loss of bowel control? N  Managing your Medications? N  Managing your Finances? N  Housekeeping or managing your Housekeeping? N    Patient Care Team: Glori Luis, MD as PCP - General (Family Medicine)  Indicate any recent Medical Services you may have received from other than [facility] providers in the past year (date may be approximate).     Assessment:   This is a routine wellness examination for Charlene Cox.  Hearing/Vision screen Hearing Screening - Comments:: No issues Vision Screening - Comments:: No issues   Goals Addressed  This Visit's Progress    Patient Stated       Wants to start back exercising       Depression Screen    09/23/2022   11:31 AM 05/27/2022    4:02 PM 01/30/2022    2:01 PM 10/16/2021    2:55 PM 07/18/2021    3:06 PM 07/17/2021   10:41 AM 04/25/2021    3:50 PM  PHQ 2/9 Scores  PHQ - 2 Score 1 2 2  0 1 1 0  PHQ- 9 Score 2 7 8         Fall Risk    09/23/2022   11:27 AM 05/27/2022    4:02 PM 01/30/2022    2:01 PM 10/16/2021    2:55 PM 07/18/2021    3:06 PM  Fall Risk   Falls in the past year? 0 0 0 0 0  Number falls in past yr: 0 0 0  0  Injury with Fall? 0 0 0  0  Risk for fall due to : No Fall Risks No Fall Risks No Fall Risks No Fall Risks No Fall Risks  Follow up Falls prevention discussed;Falls evaluation completed Falls evaluation completed Falls evaluation completed Falls evaluation completed Falls evaluation completed    MEDICARE RISK AT HOME: Medicare Risk at Home Any stairs in or around the home?: Yes If so, are there any without handrails?: No Home free of loose throw rugs in walkways, pet beds, electrical cords, etc?: Yes Adequate lighting in your home to reduce risk of falls?: Yes Life alert?: No Use of a cane, walker or w/c?: No Grab bars in the bathroom?:  No Shower chair or bench in shower?: Yes Elevated toilet seat or a handicapped toilet?: Yes   Cognitive Function:        09/23/2022   11:40 AM 06/27/2020   12:12 PM 01/19/2019    2:33 PM  6CIT Screen  What Year? 0 points 0 points 0 points  What month? 0 points 0 points 0 points  What time? 0 points 0 points 0 points  Count back from 20 0 points 0 points 0 points  Months in reverse 0 points 0 points 0 points  Repeat phrase 0 points 0 points 0 points  Total Score 0 points 0 points 0 points    Immunizations Immunization History  Administered Date(s) Administered   PFIZER(Purple Top)SARS-COV-2 Vaccination 02/09/2019, 03/09/2019, 11/13/2019   Pfizer Covid-19 Vaccine Bivalent Booster 47yrs & up 11/07/2020   Zoster Recombinant(Shingrix) 11/13/2019    TDAP status: Due, Education has been provided regarding the importance of this vaccine. Advised may receive this vaccine at local pharmacy or Health Dept. Aware to provide a copy of the vaccination record if obtained from local pharmacy or Health Dept. Verbalized acceptance and understanding.  Flu Vaccine status: Due, Education has been provided regarding the importance of this vaccine. Advised may receive this vaccine at local pharmacy or Health Dept. Aware to provide a copy of the vaccination record if obtained from local pharmacy or Health Dept. Verbalized acceptance and understanding.  Pneumococcal vaccine status: Due, Education has been provided regarding the importance of this vaccine. Advised may receive this vaccine at local pharmacy or Health Dept. Aware to provide a copy of the vaccination record if obtained from local pharmacy or Health Dept. Verbalized acceptance and understanding.  Covid-19 vaccine status: Information provided on how to obtain vaccines.   Qualifies for Shingles Vaccine? Yes   Zostavax completed No   Shingrix Completed?: No.    Education  has been provided regarding the importance of this vaccine. Patient has  been advised to call insurance company to determine out of pocket expense if they have not yet received this vaccine. Advised may also receive vaccine at local pharmacy or Health Dept. Verbalized acceptance and understanding.  Screening Tests Health Maintenance  Topic Date Due   DTaP/Tdap/Td (1 - Tdap) Never done   Colonoscopy  Never done   Pneumonia Vaccine 75+ Years old (1 of 1 - PCV) Never done   Zoster Vaccines- Shingrix (2 of 2) 01/08/2020   Medicare Annual Wellness (AWV)  07/18/2022   INFLUENZA VACCINE  Never done   COVID-19 Vaccine (5 - 2023-24 season) 09/13/2022   MAMMOGRAM  07/23/2024   DEXA SCAN  Completed   Hepatitis C Screening  Completed   HPV VACCINES  Aged Out    Health Maintenance  Health Maintenance Due  Topic Date Due   DTaP/Tdap/Td (1 - Tdap) Never done   Colonoscopy  Never done   Pneumonia Vaccine 48+ Years old (1 of 1 - PCV) Never done   Zoster Vaccines- Shingrix (2 of 2) 01/08/2020   Medicare Annual Wellness (AWV)  07/18/2022   INFLUENZA VACCINE  Never done   COVID-19 Vaccine (5 - 2023-24 season) 09/13/2022    Colorectal cancer screening,  patient is going to consider Cologuard  Mammogram status: Completed 7/24. Repeat every year  Bone Density status: Completed 5/23 . Results reflect: Bone density results: OSTEOPENIA. Repeat every 2 years.  Lung Cancer Screening: (Low Dose CT Chest recommended if Age 29-80 years, 20 pack-year currently smoking OR have quit w/in 15years.) does not qualify.     Additional Screening:  Hepatitis C Screening: does qualify; Completed 5/23  Vision Screening: Recommended annual ophthalmology exams for early detection of glaucoma and other disorders of the eye. Is the patient up to date with their annual eye exam?  Yes  Who is the provider or what is the name of the office in which the patient attends annual eye exams?  [facility]  If pt is not established with a provider, would they like to be referred to a  provider to establish care? No .   Dental Screening: Recommended annual dental exams for proper oral hygiene    Community Resource Referral / Chronic Care Management: CRR required this visit?  No   CCM required this visit?  No     Plan:     I have personally reviewed and noted the following in the patient's chart:   Medical and social history Use of alcohol, tobacco or illicit drugs  Current medications and supplements including opioid prescriptions. Patient is not currently taking opioid prescriptions. Functional ability and status Nutritional status Physical activity Advanced directives List of other physicians Hospitalizations, surgeries, and ER visits in previous 12 months Vitals Screenings to include cognitive, depression, and falls Referrals and appointments  In addition, I have reviewed and discussed with patient certain preventive protocols, quality metrics, and best practice recommendations. A written personalized care plan for preventive services as well as general preventive health recommendations were provided to patient.     Sydell Axon, LPN   4/54/0981   After Visit Summary: (MyChart) Due to this being a telephonic visit, the after visit summary with patients personalized plan was offered to patient via MyChart   Nurse Notes: None

## 2022-09-23 NOTE — Telephone Encounter (Signed)
Patient needs a PA for wellbutrin hcl xl 150 mg.

## 2022-09-24 ENCOUNTER — Other Ambulatory Visit (HOSPITAL_COMMUNITY): Payer: Self-pay

## 2022-09-24 ENCOUNTER — Telehealth: Payer: Self-pay

## 2022-09-24 NOTE — Telephone Encounter (Signed)
Pharmacy Patient Advocate Encounter   Received notification from Patient Advice Request messages that prior authorization for Wellbutrin XL 150MG  er tablets is required/requested.   Insurance verification completed.   The patient is insured through [facility] .   Per test claim: PA required; PA submitted to [facility] via CoverMyMeds Key/confirmation #/EOC [id number] Status is pending

## 2022-09-25 NOTE — Telephone Encounter (Signed)
Pharmacy Patient Advocate Encounter  Received notification from [facility] that Prior Authorization for Wellbutrin XL 150MG  er tablets has been DENIED.  Full denial letter will be uploaded to the media tab. See denial reason below.   PA #/Case ID/Reference #: [id number]

## 2022-10-07 ENCOUNTER — Ambulatory Visit: Payer: Medicare Other | Admitting: Family Medicine

## 2022-11-03 ENCOUNTER — Telehealth: Payer: Self-pay

## 2022-11-03 ENCOUNTER — Other Ambulatory Visit (HOSPITAL_COMMUNITY): Payer: Self-pay

## 2022-11-03 NOTE — Telephone Encounter (Signed)
PAP: Application for Ozempic has been submitted to PAP Companies: [employer], online  RE ENROLLMENT FOR 2025

## 2022-11-06 ENCOUNTER — Other Ambulatory Visit: Payer: Self-pay | Admitting: Nurse Practitioner

## 2022-11-06 ENCOUNTER — Ambulatory Visit (INDEPENDENT_AMBULATORY_CARE_PROVIDER_SITE_OTHER): Payer: Medicare Other | Admitting: Nurse Practitioner

## 2022-11-06 ENCOUNTER — Encounter: Payer: Self-pay | Admitting: Nurse Practitioner

## 2022-11-06 ENCOUNTER — Encounter: Payer: Self-pay | Admitting: Family Medicine

## 2022-11-06 VITALS — BP 150/80 | HR 64 | Temp 98.5°F | Ht 66.0 in | Wt 174.4 lb

## 2022-11-06 DIAGNOSIS — R350 Frequency of micturition: Secondary | ICD-10-CM | POA: Insufficient documentation

## 2022-11-06 LAB — POC URINALSYSI DIPSTICK (AUTOMATED)
Bilirubin, UA: NEGATIVE
Blood, UA: NEGATIVE
Glucose, UA: NEGATIVE
Ketones, UA: NEGATIVE
Leukocytes, UA: NEGATIVE
Nitrite, UA: NEGATIVE
Protein, UA: NEGATIVE
Spec Grav, UA: 1.01 (ref 1.010–1.025)
Urobilinogen, UA: 0.2 U/dL
pH, UA: 6.5 (ref 5.0–8.0)

## 2022-11-06 MED ORDER — OXYBUTYNIN CHLORIDE ER 5 MG PO TB24
5.0000 mg | ORAL_TABLET | Freq: Every day | ORAL | 0 refills | Status: DC
Start: 2022-11-06 — End: 2023-03-10

## 2022-11-06 NOTE — Assessment & Plan Note (Signed)
UA negative in office. Microscopic and culture pending. Discussed possible causes of urinary frequency including overactive bladder. She is interested in trying a medication to see if it helps with her symptoms due to her increased discomfort. Will trial Oxybutynin ER 5 mg at bedtime. Counseled on common side effects. Will contact patient with culture results next week and see how she is doing on the medication. Return precautions given to patient.

## 2022-11-06 NOTE — Progress Notes (Signed)
Bethanie Dicker, NP-C Phone: [phone]  Charlene Cox is a 67 y.o. female who presents today for urinary frequency.   Patient with urinary frequency and urgency that she noticed this morning. She is concerned she may have a UTI. She also notes feeling very uncomfortable due to increased bladder and suprapubic pressure. She denies back/flank pain. She denies incontinence. She has had increased stressors recently, she is unsure if this is contributing to her symptoms.  UTI:  Dysuria- No  Frequency- Yes   Urgency- Yes   Hematuria- No   Fever- No  Abd pain- Suprapubic pressure   Vaginal d/c- No  Social History   Tobacco Use  Smoking Status Never  Smokeless Tobacco Never    Current Outpatient Medications on File Prior to Visit  Medication Sig Dispense Refill   albuterol (PROAIR HFA) 108 (90 Base) MCG/ACT inhaler Inhale 1-2 puffs into the lungs every 6 (six) hours as needed for wheezing or shortness of breath. 18 g 11   ALPRAZolam (XANAX) 0.5 MG tablet Take 1 tablet (0.5 mg total) by mouth daily as needed for anxiety. For flight anxiety. 30 tablet 0   buPROPion (WELLBUTRIN XL) 150 MG 24 hr tablet TAKE 1 TABLET BY MOUTH EVERY DAY 90 tablet 1   cyclobenzaprine (FLEXERIL) 5 MG tablet TAKE 1 TABLET BY MOUTH AT BEDTIME AS NEEDED FOR MUSCLE SPASMS. 30 tablet 5   fluticasone (FLONASE) 50 MCG/ACT nasal spray SPRAY 2 SPRAYS INTO EACH NOSTRIL EVERY DAY 48 mL 2   montelukast (SINGULAIR) 10 MG tablet Take 1 tablet (10 mg total) by mouth at bedtime. 90 tablet 3   Semaglutide,0.25 or 0.5MG /DOS, (OZEMPIC, 0.25 OR 0.5 MG/DOSE,) 2 MG/1.5ML SOPN Inject 0.5 mg into the skin once a week. 1.5 mL 2   traZODone (DESYREL) 50 MG tablet TAKE 1-2 TABLETS BY MOUTH AT BEDTIME AS NEEDED FOR SLEEP. 180 tablet 2   No current facility-administered medications on file prior to visit.    ROS see history of present illness  Objective  Physical Exam Vitals:   11/06/22 1517 11/06/22 1549  BP: (!) 160/70 (!)  150/80  Pulse: 64   Temp: 98.5 F (36.9 C)   SpO2: 99%     BP Readings from Last 3 Encounters:  11/06/22 (!) 150/80  05/27/22 114/72  01/30/22 120/80   Wt Readings from Last 3 Encounters:  11/06/22 174 lb 6.4 oz (79.1 kg)  09/23/22 169 lb (76.7 kg)  05/27/22 169 lb 6.4 oz (76.8 kg)    Physical Exam Constitutional:      General: She is not in acute distress.    Appearance: Normal appearance.  HENT:     Head: Normocephalic.  Cardiovascular:     Rate and Rhythm: Normal rate and regular rhythm.     Heart sounds: Normal heart sounds.  Pulmonary:     Effort: Pulmonary effort is normal.     Breath sounds: Normal breath sounds.  Abdominal:     General: Abdomen is flat. Bowel sounds are normal. There is no distension.     Palpations: Abdomen is soft.     Tenderness: There is no abdominal tenderness.  Skin:    General: Skin is warm and dry.  Neurological:     General: No focal deficit present.     Mental Status: She is alert.  Psychiatric:        Mood and Affect: Mood normal.        Behavior: Behavior normal.    Assessment/Plan: Please see individual problem list.  Urinary frequency Assessment & Plan: UA negative in office. Microscopic and culture pending. Discussed possible causes of urinary frequency including overactive bladder. She is interested in trying a medication to see if it helps with her symptoms due to her increased discomfort. Will trial Oxybutynin ER 5 mg at bedtime. Counseled on common side effects. Will contact patient with culture results next week and see how she is doing on the medication. Return precautions given to patient.   Orders: -     POCT Urinalysis Dipstick (Automated) -     Urinalysis, Routine w reflex microscopic -     Urine Culture -     oxyBUTYnin Chloride ER; Take 1 tablet (5 mg total) by mouth at bedtime.  Dispense: 30 tablet; Refill: 0   Return if symptoms worsen or fail to improve.   Bethanie Dicker, NP-C [facility] -  [employer]

## 2022-11-07 LAB — URINE CULTURE
MICRO NUMBER:: 15644476
Result:: NO GROWTH
SPECIMEN QUALITY:: ADEQUATE

## 2022-11-07 LAB — URINALYSIS, ROUTINE W REFLEX MICROSCOPIC
Bilirubin Urine: NEGATIVE
Glucose, UA: NEGATIVE
Hgb urine dipstick: NEGATIVE
Ketones, ur: NEGATIVE
Leukocytes,Ua: NEGATIVE
Nitrite: NEGATIVE
Protein, ur: NEGATIVE
Specific Gravity, Urine: 1.004 (ref 1.001–1.035)
pH: 6.5 (ref 5.0–8.0)

## 2022-11-10 ENCOUNTER — Telehealth: Payer: Self-pay

## 2022-11-10 NOTE — Telephone Encounter (Signed)
LVMTCB in regards to urine

## 2022-11-11 ENCOUNTER — Encounter: Payer: Self-pay | Admitting: *Deleted

## 2022-11-25 NOTE — Telephone Encounter (Signed)
PAP: Patient assistance application for Ozempic has been approved by PAP Companies: [employer] from 01/13/2023 to 01/12/2024. Medication should be delivered to PAP Delivery: Provider's office For further shipping updates, please contact [employer] at [phone] Pt ID is: Waiting on approval letter

## 2022-11-27 ENCOUNTER — Ambulatory Visit: Payer: Medicare Other | Admitting: Family Medicine

## 2022-12-07 ENCOUNTER — Ambulatory Visit (INDEPENDENT_AMBULATORY_CARE_PROVIDER_SITE_OTHER): Payer: Medicare Other | Admitting: Family Medicine

## 2022-12-07 ENCOUNTER — Encounter: Payer: Self-pay | Admitting: Family Medicine

## 2022-12-07 VITALS — BP 130/82 | HR 75 | Temp 98.2°F | Ht 66.0 in | Wt 176.6 lb

## 2022-12-07 DIAGNOSIS — F419 Anxiety disorder, unspecified: Secondary | ICD-10-CM

## 2022-12-07 DIAGNOSIS — J9801 Acute bronchospasm: Secondary | ICD-10-CM

## 2022-12-07 DIAGNOSIS — R7303 Prediabetes: Secondary | ICD-10-CM | POA: Diagnosis not present

## 2022-12-07 DIAGNOSIS — F338 Other recurrent depressive disorders: Secondary | ICD-10-CM

## 2022-12-07 DIAGNOSIS — Z1211 Encounter for screening for malignant neoplasm of colon: Secondary | ICD-10-CM

## 2022-12-07 DIAGNOSIS — R41840 Attention and concentration deficit: Secondary | ICD-10-CM

## 2022-12-07 LAB — POCT GLYCOSYLATED HEMOGLOBIN (HGB A1C): Hemoglobin A1C: 5.3 % (ref 4.0–5.6)

## 2022-12-07 MED ORDER — ALPRAZOLAM 0.5 MG PO TABS
0.5000 mg | ORAL_TABLET | Freq: Every day | ORAL | 0 refills | Status: DC | PRN
Start: 2022-12-07 — End: 2023-03-10

## 2022-12-07 MED ORDER — ALBUTEROL SULFATE HFA 108 (90 BASE) MCG/ACT IN AERS: 1.0000 | INHALATION_SPRAY | Freq: Four times a day (QID) | RESPIRATORY_TRACT | 11 refills | Status: DC | PRN

## 2022-12-07 MED ORDER — BUPROPION HCL ER (XL) 150 MG PO TB24
150.0000 mg | ORAL_TABLET | Freq: Every day | ORAL | 1 refills | Status: DC
Start: 2022-12-07 — End: 2023-03-10

## 2022-12-07 NOTE — Assessment & Plan Note (Signed)
Chronic issue.  Worsened recently.  She will continue Wellbutrin 150 mg daily.  Discussed my preference for her not to take Xanax for her anxiety onsets for flight anxiety.  Discussed using trazodone for sleep.  Will refer to therapist.

## 2022-12-07 NOTE — Assessment & Plan Note (Signed)
Chronic issue.  Patient will continue Ozempic 0.5 mg weekly.  Check A1c today.

## 2022-12-07 NOTE — Progress Notes (Unsigned)
Marikay Alar, MD Phone: [phone]  Charlene Cox is a 67 y.o. female who presents today for f/u.  Anxiety: Patient reports anxiety is little worse than it has been.  She has a lot going on in her life.  She feels unappreciated at times.  Her husband's also had a detached retina and she has been dealing with that.  She takes Wellbutrin.  She uses Xanax when she flies.  She does not take trazodone.  No significant depression.  She does report a possible panic attack recently.  Prediabetes: Patient is taking Ozempic.  Notes occasional polyuria.  Social History   Tobacco Use  Smoking Status Never  Smokeless Tobacco Never    Current Outpatient Medications on File Prior to Visit  Medication Sig Dispense Refill   cyclobenzaprine (FLEXERIL) 5 MG tablet TAKE 1 TABLET BY MOUTH AT BEDTIME AS NEEDED FOR MUSCLE SPASMS. 30 tablet 5   fluticasone (FLONASE) 50 MCG/ACT nasal spray SPRAY 2 SPRAYS INTO EACH NOSTRIL EVERY DAY 48 mL 2   montelukast (SINGULAIR) 10 MG tablet Take 1 tablet (10 mg total) by mouth at bedtime. 90 tablet 3   oxybutynin (DITROPAN-XL) 5 MG 24 hr tablet Take 1 tablet (5 mg total) by mouth at bedtime. 30 tablet 0   Semaglutide,0.25 or 0.5MG /DOS, (OZEMPIC, 0.25 OR 0.5 MG/DOSE,) 2 MG/1.5ML SOPN Inject 0.5 mg into the skin once a week. 1.5 mL 2   traZODone (DESYREL) 50 MG tablet TAKE 1-2 TABLETS BY MOUTH AT BEDTIME AS NEEDED FOR SLEEP. 180 tablet 2   No current facility-administered medications on file prior to visit.     ROS see history of present illness  Objective  Physical Exam Vitals:   12/07/22 1436 12/07/22 1537  BP: (!) 142/82 130/82  Pulse: 75   Temp: 98.2 F (36.8 C)   SpO2: 98%     BP Readings from Last 3 Encounters:  12/07/22 130/82  11/06/22 (!) 150/80  05/27/22 114/72   Wt Readings from Last 3 Encounters:  12/07/22 176 lb 9.6 oz (80.1 kg)  11/06/22 174 lb 6.4 oz (79.1 kg)  09/23/22 169 lb (76.7 kg)    Physical Exam Constitutional:       General: She is not in acute distress.    Appearance: She is not diaphoretic.  Cardiovascular:     Rate and Rhythm: Normal rate and regular rhythm.     Heart sounds: Normal heart sounds.  Pulmonary:     Effort: Pulmonary effort is normal.  Neurological:     Mental Status: She is alert.      Assessment/Plan: Please see individual problem list.  Prediabetes Assessment & Plan: Chronic issue.  Patient will continue Ozempic 0.5 mg weekly.  Check A1c today.  Orders: -     POCT glycosylated hemoglobin (Hb A1C)  Anxiety Assessment & Plan: Chronic issue.  Worsened recently.  She will continue Wellbutrin 150 mg daily.  Discussed my preference for her not to take Xanax for her anxiety onsets for flight anxiety.  Discussed using trazodone for sleep.  Will refer to therapist.  Orders: -     Ambulatory referral to Psychology -     ALPRAZolam; Take 1 tablet (0.5 mg total) by mouth daily as needed for anxiety. For flight anxiety.  Dispense: 30 tablet; Refill: 0  Colon cancer screening -     Ambulatory referral to Gastroenterology  Bronchospasm -     Albuterol Sulfate HFA; Inhale 1-2 puffs into the lungs every 6 (six) hours as needed for wheezing or  shortness of breath.  Dispense: 18 g; Refill: 11  Seasonal affective disorder (HCC) -     buPROPion HCl ER (XL); Take 1 tablet (150 mg total) by mouth daily.  Dispense: 90 tablet; Refill: 1  Attention and concentration deficit -     buPROPion HCl ER (XL); Take 1 tablet (150 mg total) by mouth daily.  Dispense: 90 tablet; Refill: 1    Return in about 3 months (around 03/09/2023) for transfer of care.   Marikay Alar, MD [facility]

## 2022-12-08 ENCOUNTER — Other Ambulatory Visit: Payer: Self-pay

## 2022-12-08 ENCOUNTER — Telehealth: Payer: Self-pay

## 2022-12-08 DIAGNOSIS — Z1211 Encounter for screening for malignant neoplasm of colon: Secondary | ICD-10-CM

## 2022-12-08 MED ORDER — SUTAB 1479-225-188 MG PO TABS: 12.0000 | ORAL_TABLET | Freq: Two times a day (BID) | ORAL | 0 refills | Status: AC

## 2022-12-08 NOTE — Telephone Encounter (Signed)
Gastroenterology Pre-Procedure Review  Request Date: 12/31/22 Requesting Physician: Dr. Tobi Bastos  PATIENT REVIEW QUESTIONS: The patient responded to the following health history questions as indicated:    1. Are you having any GI issues? no 2. Do you have a personal history of Polyps? no 3. Do you have a family history of Colon Cancer or Polyps? no 4. Diabetes Mellitus? no 5. Joint replacements in the past 12 months?yes (1 year ago total hip replacement) 6. Major health problems in the past 3 months?no 7. Any artificial heart valves, MVP, or defibrillator?no    MEDICATIONS & ALLERGIES:    Patient reports the following regarding taking any anticoagulation/antiplatelet therapy:   Plavix, Coumadin, Eliquis, Xarelto, Lovenox, Pradaxa, Brilinta, or Effient? no Aspirin? no  Patient confirms/reports the following medications:  Current Outpatient Medications  Medication Sig Dispense Refill   furosemide (LASIX) 20 MG tablet Take 20 mg by mouth daily.     Sodium Sulfate-Mag Sulfate-KCl (SUTAB) [phone] MG TABS Take 12 tablets by mouth 2 (two) times daily for 1 day. 24 tablet 0   albuterol (PROAIR HFA) 108 (90 Base) MCG/ACT inhaler Inhale 1-2 puffs into the lungs every 6 (six) hours as needed for wheezing or shortness of breath. 18 g 11   ALPRAZolam (XANAX) 0.5 MG tablet Take 1 tablet (0.5 mg total) by mouth daily as needed for anxiety. For flight anxiety. 30 tablet 0   buPROPion (WELLBUTRIN XL) 150 MG 24 hr tablet Take 1 tablet (150 mg total) by mouth daily. 90 tablet 1   cyclobenzaprine (FLEXERIL) 5 MG tablet TAKE 1 TABLET BY MOUTH AT BEDTIME AS NEEDED FOR MUSCLE SPASMS. 30 tablet 5   fluticasone (FLONASE) 50 MCG/ACT nasal spray SPRAY 2 SPRAYS INTO EACH NOSTRIL EVERY DAY 48 mL 2   montelukast (SINGULAIR) 10 MG tablet Take 1 tablet (10 mg total) by mouth at bedtime. 90 tablet 3   oxybutynin (DITROPAN-XL) 5 MG 24 hr tablet Take 1 tablet (5 mg total) by mouth at bedtime. 30 tablet 0    Semaglutide,0.25 or 0.5MG /DOS, (OZEMPIC, 0.25 OR 0.5 MG/DOSE,) 2 MG/1.5ML SOPN Inject 0.5 mg into the skin once a week. 1.5 mL 2   traZODone (DESYREL) 50 MG tablet TAKE 1-2 TABLETS BY MOUTH AT BEDTIME AS NEEDED FOR SLEEP. 180 tablet 2   No current facility-administered medications for this visit.    Patient confirms/reports the following allergies:  Allergies  Allergen Reactions   Tizanidine Other (See Comments)    ...after 1 pill of that muscle relaxer about 4 hours later when I got up to pee there was a discomfort it seemed with my urinary track almost like a serious UTI.  I took Tylenol and it began to subside...it had to be my reaction to the muscle relaxer..thanks so much. Charlene Cox   Miralax [Polyethylene Glycol] Other (See Comments)    ?reaction stomach cramp per patient.       No orders of the defined types were placed in this encounter.   AUTHORIZATION INFORMATION Primary Insurance: 1D#: Group #:  Secondary Insurance: 1D#: Group #:  SCHEDULE INFORMATION: Date: 12/31/22 Time: Location: [facility]

## 2022-12-17 ENCOUNTER — Telehealth: Payer: Self-pay

## 2022-12-17 ENCOUNTER — Other Ambulatory Visit (HOSPITAL_COMMUNITY): Payer: Self-pay

## 2022-12-17 ENCOUNTER — Other Ambulatory Visit: Payer: Self-pay | Admitting: Family Medicine

## 2022-12-17 DIAGNOSIS — R0981 Nasal congestion: Secondary | ICD-10-CM

## 2022-12-17 NOTE — Telephone Encounter (Signed)
Received fax from [employer] regarding insurance verification for 2025 re enrollment, completed form and faxed back to [employer] with front and back of insurance card

## 2022-12-30 ENCOUNTER — Telehealth: Payer: Self-pay

## 2022-12-30 ENCOUNTER — Other Ambulatory Visit: Payer: Self-pay

## 2022-12-30 DIAGNOSIS — Z1211 Encounter for screening for malignant neoplasm of colon: Secondary | ICD-10-CM

## 2022-12-30 NOTE — Telephone Encounter (Signed)
Patient called in to reschedule her colonoscopy.

## 2022-12-30 NOTE — Telephone Encounter (Signed)
Call returned to reschedule patients colonoscopy. Colonoscopy has been rescheduled from tomorrow with Dr. Tobi Bastos due to patients husband is sick and unable to bring her.  New date is 02/08/23.  Referral updated.  Instructions updated.  Thanks, [place], [place]

## 2022-12-31 ENCOUNTER — Ambulatory Visit: Admission: RE | Admit: 2022-12-31 | Payer: Medicare Other | Source: Home / Self Care | Admitting: Gastroenterology

## 2022-12-31 ENCOUNTER — Telehealth: Payer: Self-pay

## 2022-12-31 SURGERY — COLONOSCOPY WITH PROPOFOL
Anesthesia: General

## 2022-12-31 NOTE — Telephone Encounter (Signed)
Copied from CRM [id number]. Topic: Clinical - Medication Question >> Dec 31, 2022 12:23 PM Florestine Avers wrote: Reason for CRM: Patient called in stating that she would like a call back in regards to her Ozempic. She wants to know if its in the office and ready to be picked.

## 2022-12-31 NOTE — Telephone Encounter (Signed)
Spoke to pt. Informed pt that we didn't have any medications at this time.

## 2023-01-28 ENCOUNTER — Ambulatory Visit: Payer: Medicare Other | Admitting: Dermatology

## 2023-02-04 ENCOUNTER — Telehealth: Payer: Self-pay

## 2023-02-04 NOTE — Telephone Encounter (Signed)
Call returned.  Colonoscopy has been rescheduled to 03/15/23 with Dr. Tobi Bastos due to not feeling well.  Endoscopy has been notified of date change.  Referral updated.  Thanks,  [place], [place]

## 2023-02-04 NOTE — Telephone Encounter (Signed)
Pt requesting call back to cancel and reschedule colonoscopy

## 2023-02-04 NOTE — Telephone Encounter (Signed)
The patient called in and left a voicemail requesting to reschedule colonoscopy. She took her daughter to the hospital and she had the flu. She is now feeling some symptoms and needs to reschedule.I called her back to let her know we received her message, and I sent the message to the scheduler.

## 2023-02-04 NOTE — Telephone Encounter (Signed)
Patients colonoscopy has been rescheduled to 03/15/23 due to her being sick.  She also wanted to make physician aware that her orthopedic surgeon advised that prior to any procedure she takes Amoxicillin 2,000 mg 1 hour prior to procedure.  She said she has Amoxicillin.  Hip Surgery was last year. Total Hip Replacement on the Right.  Thanks,  [place], [place]

## 2023-02-18 ENCOUNTER — Ambulatory Visit: Payer: Medicare Other | Admitting: Dermatology

## 2023-02-26 ENCOUNTER — Encounter: Payer: Self-pay | Admitting: Nurse Practitioner

## 2023-03-02 ENCOUNTER — Encounter: Payer: Self-pay | Admitting: Nurse Practitioner

## 2023-03-10 ENCOUNTER — Telehealth: Payer: Self-pay

## 2023-03-10 ENCOUNTER — Encounter: Payer: Self-pay | Admitting: Nurse Practitioner

## 2023-03-10 ENCOUNTER — Ambulatory Visit (INDEPENDENT_AMBULATORY_CARE_PROVIDER_SITE_OTHER): Payer: Medicare Other | Admitting: Nurse Practitioner

## 2023-03-10 VITALS — BP 138/72 | HR 62 | Temp 98.2°F | Ht 66.0 in | Wt 167.0 lb

## 2023-03-10 DIAGNOSIS — R051 Acute cough: Secondary | ICD-10-CM | POA: Diagnosis not present

## 2023-03-10 DIAGNOSIS — F419 Anxiety disorder, unspecified: Secondary | ICD-10-CM

## 2023-03-10 DIAGNOSIS — R41840 Attention and concentration deficit: Secondary | ICD-10-CM | POA: Insufficient documentation

## 2023-03-10 DIAGNOSIS — G8918 Other acute postprocedural pain: Secondary | ICD-10-CM | POA: Insufficient documentation

## 2023-03-10 DIAGNOSIS — R7303 Prediabetes: Secondary | ICD-10-CM

## 2023-03-10 DIAGNOSIS — G4700 Insomnia, unspecified: Secondary | ICD-10-CM | POA: Insufficient documentation

## 2023-03-10 DIAGNOSIS — F338 Other recurrent depressive disorders: Secondary | ICD-10-CM | POA: Insufficient documentation

## 2023-03-10 MED ORDER — BUPROPION HCL ER (XL) 150 MG PO TB24
150.0000 mg | ORAL_TABLET | Freq: Every day | ORAL | 3 refills | Status: AC
Start: 2023-03-10 — End: ?

## 2023-03-10 MED ORDER — PSEUDOEPH-BROMPHEN-DM 30-2-10 MG/5ML PO SYRP: 5.0000 mL | ORAL_SOLUTION | Freq: Four times a day (QID) | ORAL | 0 refills | Status: DC | PRN

## 2023-03-10 MED ORDER — ALPRAZOLAM 0.5 MG PO TABS
0.5000 mg | ORAL_TABLET | Freq: Every day | ORAL | 2 refills | Status: DC | PRN
Start: 2023-03-10 — End: 2023-05-31

## 2023-03-10 MED ORDER — HYDROCODONE-ACETAMINOPHEN 5-325 MG PO TABS
1.0000 | ORAL_TABLET | Freq: Two times a day (BID) | ORAL | 0 refills | Status: AC | PRN
Start: 2023-03-10 — End: 2023-03-15

## 2023-03-10 MED ORDER — TRAZODONE HCL 50 MG PO TABS
50.0000 mg | ORAL_TABLET | Freq: Every evening | ORAL | 2 refills | Status: AC | PRN
Start: 2023-03-10 — End: ?

## 2023-03-10 NOTE — Telephone Encounter (Signed)
 Patient called regarding her and husbands Ozempic patient assistance delivery.  I called [employer] and they are still behind on shipments.  Both her and her husband are eligible for the voucher for a free 30 day supply at the pharmacy of their chosing.  Gave patient voucher number to call [phone]

## 2023-03-10 NOTE — Assessment & Plan Note (Signed)
 There is a new cough, possibly due to recent exposure to a sick contact, with no other symptoms of infection. Likely from postnasal drip. Will prescribe Bromfed DM. Counseled on common side effects. Return precautions given to patient.

## 2023-03-10 NOTE — Assessment & Plan Note (Addendum)
 Scheduled for a cosmetic procedure (Ultherapy) on 04/02/2023, there is anxiety about potential pain. She has Xanax, advised to take one Xanax 0.5 mg tablet 30 minutes to 1 hour before the procedure. Prescribe a short course of pain medication for use after the procedure. PDMP reviewed. Counseled on common side effects.

## 2023-03-10 NOTE — Assessment & Plan Note (Signed)
 Well controlled on Ozempic 0.5 mg weekly. Continue. Last A1c- 5.3. Encouraged healthy diet and exercise.

## 2023-03-10 NOTE — Assessment & Plan Note (Signed)
 Taking Trazodone 50-100 mg at bedtime as needed. Refill Trazodone prescription.

## 2023-03-10 NOTE — Progress Notes (Signed)
 Bethanie Dicker, NP-C Phone: [phone]  Charlene Cox is a 68 y.o. female who presents today for transfer of care.   Discussed the use of AI scribe software for clinical note transcription with the patient, who gave verbal consent to proceed.  History of Present Illness   Charlene Cox is a 68 year old female who presents for a transfer of care and discussion of pain management for an upcoming cosmetic procedure.   She is scheduled for a cosmetic procedure at [facility] on March 21st, which involves skin heating. She is concerned about the pain, described as similar to a curling iron burn, due to her low pain tolerance. She is exploring pain management options, including Lyrica, Xanax, Percocet, and laughing gas, which is available at the facility.  She has a recent onset of a persistent cough with postnasal drainage, likely from exposure to her daughter recovering from an illness. No sore throat, congestion, shortness of breath, or fever. She has tried honey, cough drops, and cough syrup with limited relief.  Her current medications include Xanax, Wellbutrin, trazodone, Singulair, Flonase, and an inhaler as needed. She has temporarily stopped Ozempic for an upcoming colonoscopy. She is not on medication for overactive bladder anymore.  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      Social History   Tobacco Use  Smoking Status Never  Smokeless Tobacco Never    Current Outpatient Medications on File Prior to Visit  Medication Sig Dispense Refill   albuterol (PROAIR HFA) 108 (90 Base) MCG/ACT inhaler Inhale 1-2 puffs into the lungs every 6 (six) hours as needed for wheezing or shortness of breath. 18 g 11   cyclobenzaprine (FLEXERIL) 5 MG tablet TAKE 1 TABLET BY MOUTH AT BEDTIME AS NEEDED FOR MUSCLE SPASMS. 30 tablet 5   fluticasone (FLONASE) 50  MCG/ACT nasal spray SPRAY 2 SPRAYS INTO EACH NOSTRIL EVERY DAY 48 mL 2   furosemide (LASIX) 20 MG tablet Take 20 mg by mouth daily.     montelukast (SINGULAIR) 10 MG tablet Take 1 tablet (10 mg total) by mouth at bedtime. 90 tablet 3   Semaglutide,0.25 or 0.5MG /DOS, (OZEMPIC, 0.25 OR 0.5 MG/DOSE,) 2 MG/1.5ML SOPN Inject 0.5 mg into the skin once a week. 1.5 mL 2   No current facility-administered medications on file prior to visit.    ROS see history of present illness  Objective  Physical Exam Vitals:   03/10/23 1352  BP: 138/72  Pulse: 62  Temp: 98.2 F (36.8 C)  SpO2: 98%    BP Readings from Last 3 Encounters:  03/10/23 138/72  12/07/22 130/82  11/06/22 (!) 150/80   Wt Readings from Last 3 Encounters:  03/10/23 167 lb (75.8 kg)  12/07/22 176 lb 9.6 oz (80.1 kg)  11/06/22 174 lb 6.4 oz (79.1 kg)    Physical Exam Constitutional:      General: She is not in acute distress.    Appearance: Normal appearance.  HENT:     Head: Normocephalic.  Cardiovascular:     Rate and Rhythm: Normal rate and regular rhythm.     Heart sounds: Normal heart sounds.  Pulmonary:     Effort: Pulmonary effort is normal.     Breath sounds: Normal breath sounds.  Skin:    General: Skin is warm and dry.  Neurological:     General: No focal deficit present.  Mental Status: She is alert.  Psychiatric:        Mood and Affect: Mood normal.        Behavior: Behavior normal.    Assessment/Plan: Please see individual problem list.  Acute cough Assessment & Plan: There is a new cough, possibly due to recent exposure to a sick contact, with no other symptoms of infection. Likely from postnasal drip. Will prescribe Bromfed DM. Counseled on common side effects. Return precautions given to patient.   Orders: -     Pseudoeph-Bromphen-DM; Take 5-10 mLs by mouth every 6 (six) hours as needed.  Dispense: 120 mL; Refill: 0  Other acute postprocedural pain Assessment & Plan: Scheduled for a  cosmetic procedure (Ultherapy) on 04/02/2023, there is anxiety about potential pain. She has Xanax, advised to take one Xanax 0.5 mg tablet 30 minutes to 1 hour before the procedure. Prescribe a short course of pain medication for use after the procedure. PDMP reviewed. Counseled on common side effects.   Orders: -     HYDROcodone-Acetaminophen; Take 1 tablet by mouth 2 (two) times daily as needed for up to 5 days for moderate pain (pain score 4-6) or severe pain (pain score 7-10).  Dispense: 10 tablet; Refill: 0  Anxiety Assessment & Plan: Referral to a therapist for counseling was discussed. Ensure the referral to behavioral health/psychology was previously placed. Symptoms are adequately controlled at this time. Current medications include Xanax 0.5 mg as needed and Wellbutrin XL 150 mg daily. Continue current medications as prescribed. Encouraged to contact if worsening symptoms, unusual behavior changes or suicidal thoughts occur. Non-Opioid Controlled Substance Agreement signed today. PDMP reviewed.   Orders: -     ALPRAZolam; Take 1 tablet (0.5 mg total) by mouth daily as needed for anxiety. For flight anxiety.  Dispense: 30 tablet; Refill: 2  Insomnia, unspecified type Assessment & Plan: Taking Trazodone 50-100 mg at bedtime as needed. Refill Trazodone prescription.   Orders: -     traZODone HCl; Take 1-2 tablets (50-100 mg total) by mouth at bedtime as needed. for sleep  Dispense: 180 tablet; Refill: 2  Prediabetes Assessment & Plan: Well controlled on Ozempic 0.5 mg weekly. Continue. Last A1c- 5.3. Encouraged healthy diet and exercise.    Seasonal affective disorder (HCC) -     buPROPion HCl ER (XL); Take 1 tablet (150 mg total) by mouth daily.  Dispense: 90 tablet; Refill: 3  Attention and concentration deficit -     buPROPion HCl ER (XL); Take 1 tablet (150 mg total) by mouth daily.  Dispense: 90 tablet; Refill: 3    Return in about 6 months (around 09/07/2023) for Follow  up.   Bethanie Dicker, NP-C [facility]

## 2023-03-10 NOTE — Assessment & Plan Note (Addendum)
 Referral to a therapist for counseling was discussed. Ensure the referral to behavioral health/psychology was previously placed. Symptoms are adequately controlled at this time. Current medications include Xanax 0.5 mg as needed and Wellbutrin XL 150 mg daily. Continue current medications as prescribed. Encouraged to contact if worsening symptoms, unusual behavior changes or suicidal thoughts occur. Non-Opioid Controlled Substance Agreement signed today. PDMP reviewed.

## 2023-03-13 IMAGING — MG MM DIGITAL SCREENING BILAT W/ TOMO AND CAD
8 series · 8 of 24 positions shown · non-contrast
Comparison: Previous exam(s).

CLINICAL DATA: Screening.

EXAM:
DIGITAL SCREENING BILATERAL MAMMOGRAM WITH TOMOSYNTHESIS AND CAD
TECHNIQUE: Bilateral screening digital craniocaudal and mediolateral oblique
mammograms were obtained. Bilateral screening digital breast
tomosynthesis was performed. The images were evaluated with
computer-aided detection.

[L MLO synth-2D]
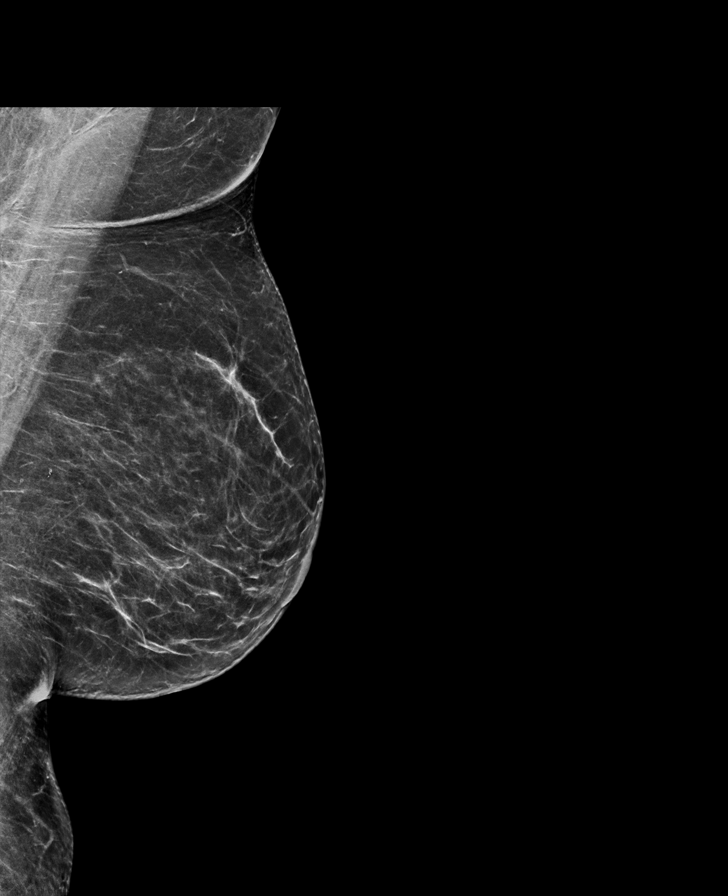

[R MLO synth-2D]
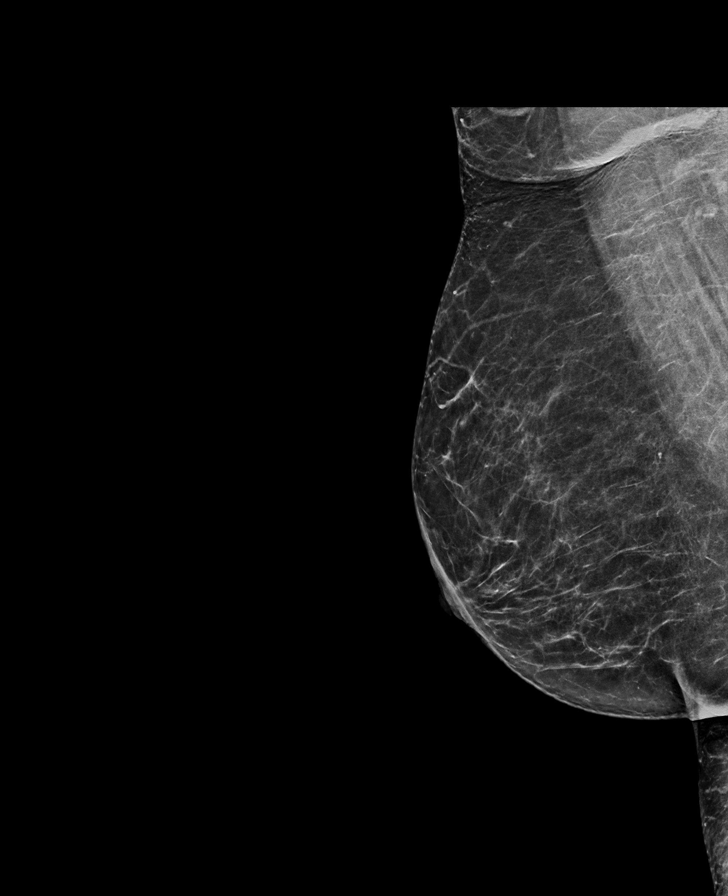

[R CC synth-2D]
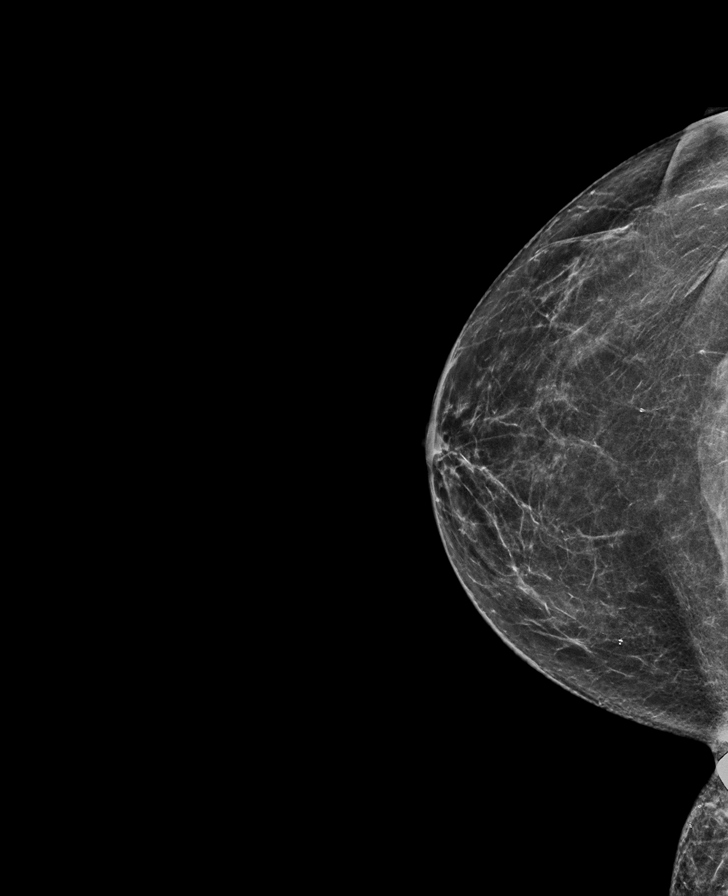

[L CC synth-2D]
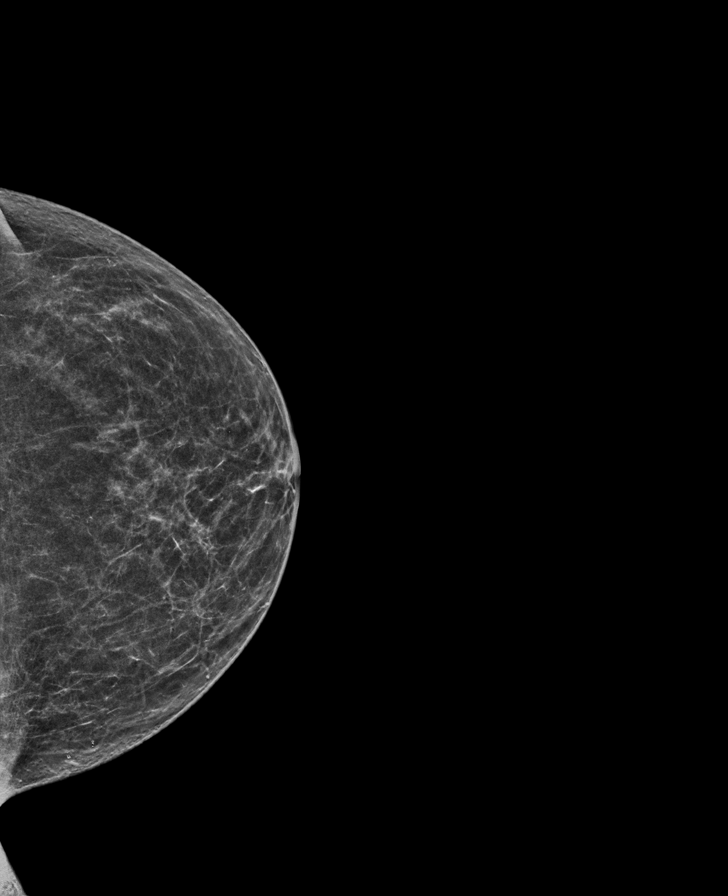

[L CC tomo · tomo slice 27/54.0]
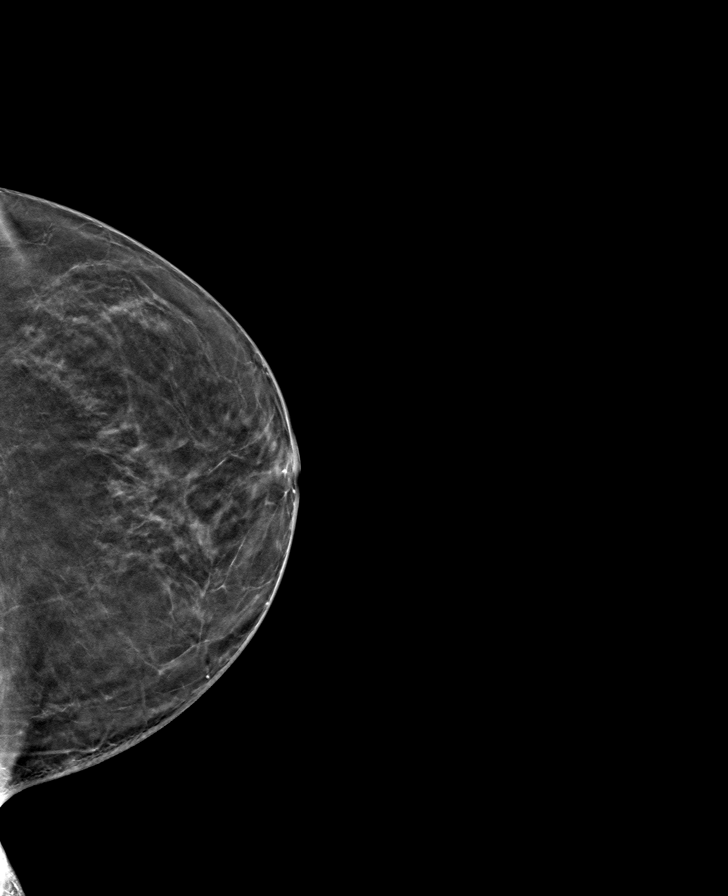

[R MLO tomo · tomo slice 33/64.0]
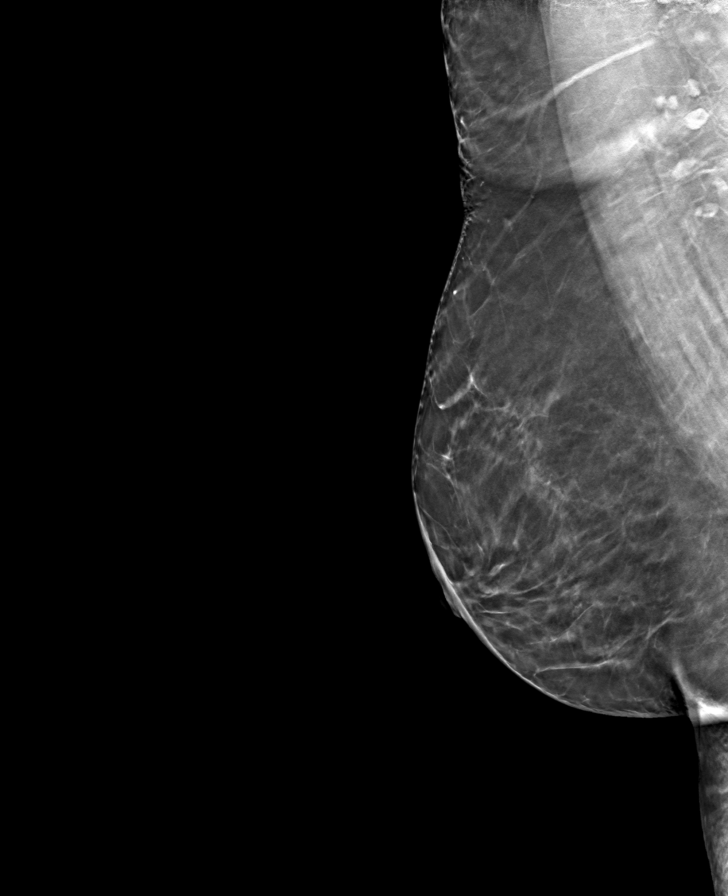

[R CC tomo · tomo slice 31/62.0]
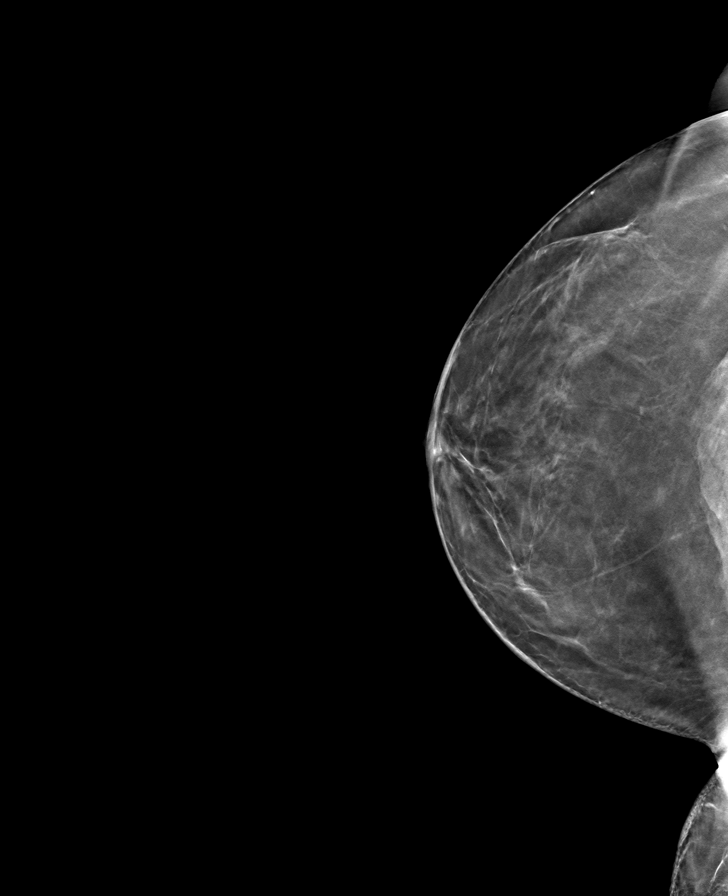

[L MLO tomo · tomo slice 33/65.0]
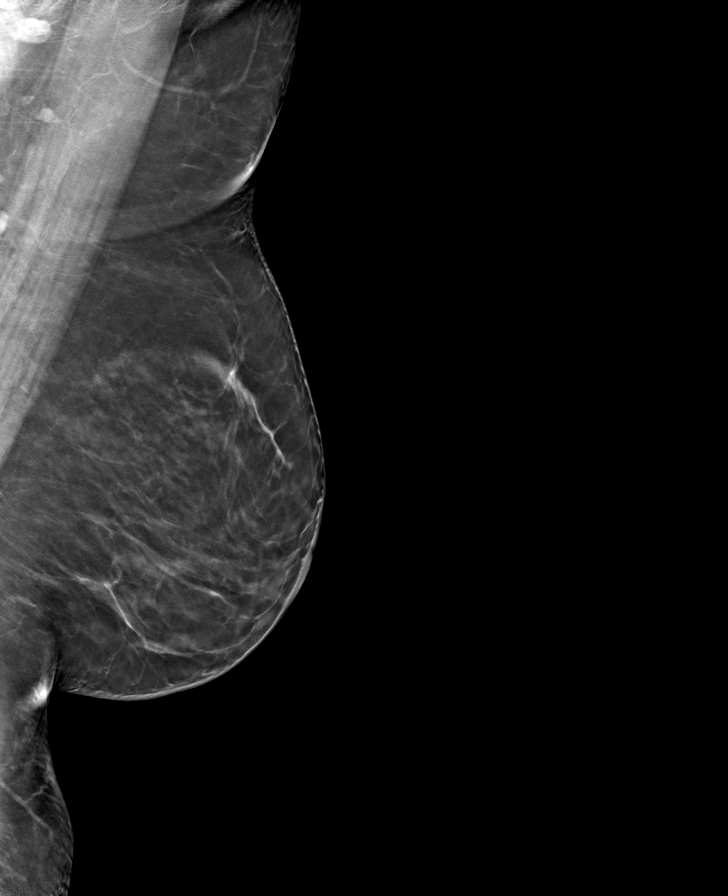

[8 of 24 positions shown; findings below may reference images not displayed]

ACR Breast Density Category b: There are scattered areas of
fibroglandular density.
FINDINGS: There are no findings suspicious for malignancy.
IMPRESSION: No mammographic evidence of malignancy. A result letter of this
screening mammogram will be mailed directly to the patient.

RECOMMENDATION:
Screening mammogram in one year. (Code:51-O-LD2)

BI-RADS CATEGORY  1: Negative.

## 2023-03-15 ENCOUNTER — Ambulatory Visit

## 2023-03-15 ENCOUNTER — Encounter: Payer: Self-pay | Admitting: Gastroenterology

## 2023-03-15 ENCOUNTER — Ambulatory Visit
Admission: RE | Admit: 2023-03-15 | Discharge: 2023-03-15 | Disposition: A | Discharge status: Home / Self Care | Payer: Medicare Other | Attending: Gastroenterology | Admitting: Gastroenterology

## 2023-03-15 ENCOUNTER — Encounter
Admission: RE | Disposition: A | Discharge status: Home / Self Care | Payer: Self-pay | Source: Home / Self Care | Attending: Gastroenterology

## 2023-03-15 DIAGNOSIS — K640 First degree hemorrhoids: Secondary | ICD-10-CM | POA: Insufficient documentation

## 2023-03-15 DIAGNOSIS — I10 Essential (primary) hypertension: Secondary | ICD-10-CM | POA: Insufficient documentation

## 2023-03-15 DIAGNOSIS — Z1211 Encounter for screening for malignant neoplasm of colon: Secondary | ICD-10-CM | POA: Diagnosis not present

## 2023-03-15 DIAGNOSIS — K5730 Diverticulosis of large intestine without perforation or abscess without bleeding: Secondary | ICD-10-CM | POA: Insufficient documentation

## 2023-03-15 DIAGNOSIS — K635 Polyp of colon: Secondary | ICD-10-CM

## 2023-03-15 DIAGNOSIS — D126 Benign neoplasm of colon, unspecified: Secondary | ICD-10-CM

## 2023-03-15 SURGERY — COLONOSCOPY WITH PROPOFOL
Anesthesia: General

## 2023-03-15 MED ORDER — PROPOFOL 500 MG/50ML IV EMUL
INTRAVENOUS | Status: DC | PRN
  Administered 2023-03-15: 80 mg via INTRAVENOUS
  Administered 2023-03-15: 150 ug/kg/min via INTRAVENOUS

## 2023-03-15 MED ORDER — SODIUM CHLORIDE 0.9 % IV SOLN: INTRAVENOUS | Status: DC

## 2023-03-15 NOTE — Transfer of Care (Signed)
 Immediate Anesthesia Transfer of Care Note  Patient: Charlene Cox  Procedure(s) Performed: COLONOSCOPY WITH PROPOFOL POLYPECTOMY  Patient Location: PACU  Anesthesia Type:General  Level of Consciousness: awake  Airway & Oxygen Therapy: Patient Spontanous Breathing  Post-op Assessment: Report given to RN and Post -op Vital signs reviewed and stable  Post vital signs: Reviewed and stable  Last Vitals:  Vitals Value Taken Time  BP 134/66 03/15/23 1106  Temp 36.1 C 03/15/23 1105  Pulse 66 03/15/23 1107  Resp 11 03/15/23 1107  SpO2 100 % 03/15/23 1107  Vitals shown include unfiled device data.  Last Pain:  Vitals:   03/15/23 1105  TempSrc: Temporal  PainSc: Asleep         Complications: There were no known notable events for this encounter.

## 2023-03-15 NOTE — Anesthesia Postprocedure Evaluation (Signed)
 Anesthesia Post Note  Patient: Charlene Cox  Procedure(s) Performed: COLONOSCOPY WITH PROPOFOL POLYPECTOMY  Patient location during evaluation: Endoscopy Anesthesia Type: General Level of consciousness: awake and alert Pain management: pain level controlled Vital Signs Assessment: post-procedure vital signs reviewed and stable Respiratory status: spontaneous breathing, nonlabored ventilation, respiratory function stable and patient connected to nasal cannula oxygen Cardiovascular status: blood pressure returned to baseline and stable Postop Assessment: no apparent nausea or vomiting Anesthetic complications: no   There were no known notable events for this encounter.   Last Vitals:  Vitals:   03/15/23 1115 03/15/23 1125  BP: (!) 160/78 (!) 177/72  Pulse:    Resp:    Temp:    SpO2:      Last Pain:  Vitals:   03/15/23 1125  TempSrc:   PainSc: 0-No pain                 Corinda Gubler

## 2023-03-15 NOTE — Anesthesia Preprocedure Evaluation (Signed)
 Anesthesia Evaluation  Patient identified by MRN, date of birth, ID band Patient awake    Reviewed: Allergy & Precautions, NPO status , Patient's Chart, lab work & pertinent test results  History of Anesthesia Complications Negative for: history of anesthetic complications  Airway Mallampati: III  TM Distance: >3 FB Neck ROM: Full    Dental  (+) Teeth Intact, Caps   Pulmonary neg pulmonary ROS, neg sleep apnea, neg COPD, Patient abstained from smoking.Not current smoker   Pulmonary exam normal breath sounds clear to auscultation       Cardiovascular Exercise Tolerance: Good METShypertension, Pt. on medications (-) CAD and (-) Past MI (-) dysrhythmias  Rhythm:Regular Rate:Normal - Systolic murmurs    Neuro/Psych  PSYCHIATRIC DISORDERS Anxiety Depression    negative neurological ROS     GI/Hepatic ,neg GERD  ,,(+)     (-) substance abuse    Endo/Other  neg diabetes    Renal/GU negative Renal ROS     Musculoskeletal   Abdominal   Peds  Hematology   Anesthesia Other Findings Past Medical History: No date: Allergy No date: Anxiety     Comment:  needs with flying, bloodwork No date: Arthritis No date: Chronic low back pain No date: COVID-19     Comment:  x2 03/28/2016: HTN (hypertension) No date: Osteoarthritis of left hip     Comment:  severe determined by x-ray  Reproductive/Obstetrics                             Anesthesia Physical Anesthesia Plan  ASA: 2  Anesthesia Plan: General   Post-op Pain Management: Minimal or no pain anticipated   Induction: Intravenous  PONV Risk Score and Plan: 3 and Propofol infusion, TIVA and Ondansetron  Airway Management Planned: Nasal Cannula  Additional Equipment: None  Intra-op Plan:   Post-operative Plan:   Informed Consent: I have reviewed the patients History and Physical, chart, labs and discussed the procedure including the  risks, benefits and alternatives for the proposed anesthesia with the patient or authorized representative who has indicated his/her understanding and acceptance.     Dental advisory given  Plan Discussed with: CRNA and Surgeon  Anesthesia Plan Comments: (Discussed risks of anesthesia with patient, including possibility of difficulty with spontaneous ventilation under anesthesia necessitating airway intervention, PONV, and rare risks such as cardiac or respiratory or neurological events, and allergic reactions. Discussed the role of CRNA in patient's perioperative care. Patient understands.)       Anesthesia Quick Evaluation

## 2023-03-15 NOTE — H&P (Signed)
 Wyline Mood, MD [address], [address], [place], [place], [address] [address], [place], [place], [address] Phone: [phone]  Fax: [phone]  Primary Care Physician:  Bethanie Dicker, NP   Pre-Procedure History & Physical: HPI:  Charlene Cox is a 68 y.o. female is here for an colonoscopy.   Past Medical History:  Diagnosis Date   Allergy    Anxiety    needs with flying, bloodwork   Arthritis    Chronic low back pain    COVID-19    x2   HTN (hypertension) 03/28/2016   Osteoarthritis of left hip    severe determined by x-ray    Past Surgical History:  Procedure Laterality Date   COSMETIC SURGERY  07/2018   tummy tuck and facelift   EYE SURGERY Right 06/2022   left total hip Left    08/27/21 [facility] Dr. Sullivan Lone    Prior to Admission medications   Medication Sig Start Date End Date Taking? Authorizing Provider  amoxicillin (AMOXIL) 500 MG tablet Take 2,000 mg by mouth once. Before procedure due to hx of total hip replacement   Yes [provider]  albuterol (PROAIR HFA) 108 (90 Base) MCG/ACT inhaler Inhale 1-2 puffs into the lungs every 6 (six) hours as needed for wheezing or shortness of breath. 12/07/22   Glori Luis, MD  ALPRAZolam Prudy Feeler) 0.5 MG tablet Take 1 tablet (0.5 mg total) by mouth daily as needed for anxiety. For flight anxiety. 03/10/23   Bethanie Dicker, NP  brompheniramine-pseudoephedrine-DM 30-2-10 MG/5ML syrup Take 5-10 mLs by mouth every 6 (six) hours as needed. 03/10/23   Bethanie Dicker, NP  buPROPion (WELLBUTRIN XL) 150 MG 24 hr tablet Take 1 tablet (150 mg total) by mouth daily. 03/10/23   Bethanie Dicker, NP  cyclobenzaprine (FLEXERIL) 5 MG tablet TAKE 1 TABLET BY MOUTH AT BEDTIME AS NEEDED FOR MUSCLE SPASMS. 05/09/21   McLean-Scocuzza, Pasty Spillers, MD  fluticasone (FLONASE) 50 MCG/ACT nasal spray SPRAY 2 SPRAYS INTO EACH NOSTRIL EVERY DAY 12/17/22   Glori Luis, MD  furosemide (LASIX) 20 MG tablet Take 20 mg by  mouth daily. 12/07/22   [provider]  HYDROcodone-acetaminophen (NORCO/VICODIN) 5-325 MG tablet Take 1 tablet by mouth 2 (two) times daily as needed for up to 5 days for moderate pain (pain score 4-6) or severe pain (pain score 7-10). 03/10/23 03/15/23  Bethanie Dicker, NP  montelukast (SINGULAIR) 10 MG tablet Take 1 tablet (10 mg total) by mouth at bedtime. 04/16/21   McLean-Scocuzza, Pasty Spillers, MD  Semaglutide,0.25 or 0.5MG /DOS, (OZEMPIC, 0.25 OR 0.5 MG/DOSE,) 2 MG/1.5ML SOPN Inject 0.5 mg into the skin once a week. 10/30/21   Glori Luis, MD  traZODone (DESYREL) 50 MG tablet Take 1-2 tablets (50-100 mg total) by mouth at bedtime as needed. for sleep 03/10/23   Bethanie Dicker, NP    Allergies as of 12/30/2022 - Review Complete 12/24/2022  Allergen Reaction Noted   Tizanidine Other (See Comments) 05/09/2021   Miralax [polyethylene glycol] Other (See Comments) 04/16/2021    History reviewed. No pertinent family history.  Social History   Socioeconomic History   Marital status: Married    Spouse name: Not on file   Number of children: Not on file   Years of education: Not on file   Highest education level: Not on file  Occupational History   Occupation: semi retired  Tobacco Use   Smoking status: Never   Smokeless tobacco: Never  Advertising account planner  Vaping status: Never Used  Substance and Sexual Activity   Alcohol use: No    Alcohol/week: 0.0 standard drinks of alcohol   Drug use: No   Sexual activity: Not Currently  Other Topics Concern   Not on file  Social History Narrative   Married   Social Drivers of Health   Financial Resource Strain: Low Risk  (09/23/2022)   Overall Financial Resource Strain (CARDIA)    Difficulty of Paying Living Expenses: Not hard at all  Food Insecurity: No Food Insecurity (09/23/2022)   Hunger Vital Sign    Worried About Running Out of Food in the Last Year: Never true    Ran Out of Food in the Last Year: Never true  Transportation Needs: No  Transportation Needs (09/23/2022)   PRAPARE - Administrator, Civil Service (Medical): No    Lack of Transportation (Non-Medical): No  Physical Activity: Inactive (09/23/2022)   Exercise Vital Sign    Days of Exercise per Week: 0 days    Minutes of Exercise per Session: 0 min  Stress: No Stress Concern Present (09/23/2022)   [employer] of Occupational Health - Occupational Stress Questionnaire    Feeling of Stress : Only a little  Social Connections: Socially Integrated (09/23/2022)   Social Connection and Isolation Panel [NHANES]    Frequency of Communication with Friends and Family: More than three times a week    Frequency of Social Gatherings with Friends and Family: More than three times a week    Attends Religious Services: More than 4 times per year    Active Member of [employer] or Organizations: Yes    Attends Engineer, structural: More than 4 times per year    Marital Status: Married  Catering manager Violence: Not At Risk (09/23/2022)   Humiliation, Afraid, Rape, and Kick questionnaire    Fear of Current or Ex-Partner: No    Emotionally Abused: No    Physically Abused: No    Sexually Abused: No    Review of Systems: See HPI, otherwise negative ROS  Physical Exam: There were no vitals taken for this visit. General:   Alert,  pleasant and cooperative in NAD Head:  Normocephalic and atraumatic. Neck:  Supple; no masses or thyromegaly. Lungs:  Clear throughout to auscultation, normal respiratory effort.    Heart:  +S1, +S2, Regular rate and rhythm, No edema. Abdomen:  Soft, nontender and nondistended. Normal bowel sounds, without guarding, and without rebound.   Neurologic:  Alert and  oriented x4;  grossly normal neurologically.  Impression/Plan: Charlene Cox is here for an colonoscopy to be performed for Screening colonoscopy average risk   Risks, benefits, limitations, and alternatives regarding  colonoscopy have been reviewed with the  patient.  Questions have been answered.  All parties agreeable.   Wyline Mood, MD  03/15/2023, 9:51 AM

## 2023-03-15 NOTE — Op Note (Addendum)
 [facility] Gastroenterology Patient Name: Charlene Cox Procedure Date: 03/15/2023 10:19 AM MRN: [id number] Account #: [id number] Date of Birth: 25-Mar-1955 Admit Type: Outpatient Age: 68 Room: [facility] ENDO ROOM 1 Gender: Female Note Status: Finalized Instrument Name: Prentice Docker [id number] Procedure:             Colonoscopy Indications:           Screening for colorectal malignant neoplasm Providers:             Wyline Mood MD, MD Referring MD:          Bethanie Dicker (Referring MD) Medicines:             Monitored Anesthesia Care Complications:         No immediate complications. Procedure:             Pre-Anesthesia Assessment:                        - Prior to the procedure, a History and Physical was                         performed, and patient medications, allergies and                         sensitivities were reviewed. The patient's tolerance                         of previous anesthesia was reviewed.                        - The risks and benefits of the procedure and the                         sedation options and risks were discussed with the                         patient. All questions were answered and informed                         consent was obtained.                        - ASA Grade Assessment: II - A patient with mild                         systemic disease.                        After obtaining informed consent, the colonoscope was                         passed under direct vision. Throughout the procedure,                         the patient's blood pressure, pulse, and oxygen                         saturations were monitored continuously. The                         Colonoscope was introduced through  the anus and                         advanced to the the cecum, identified by the                         appendiceal orifice. The colonoscopy was performed                         with ease. The colonoscopy was technically difficult                          and complex due to a tortuous colon. Successful                         completion of the procedure was aided by withdrawing                         the scope and replacing with the pediatric colonoscope. Findings:      The perianal and digital rectal examinations were normal.      Multiple medium-mouthed diverticula were found in the entire colon.      A 4 mm polyp was found in the ascending colon. The polyp was sessile.       The polyp was removed with a cold snare. Resection was complete, but the       polyp tissue was not retrieved.      Bleeding non-thrombosed internal hemorrhoids were found during       retroflexion. The hemorrhoids were large and Grade I (internal       hemorrhoids that do not prolapse). Minimal bl;eeding due to scope trauma      The exam was otherwise without abnormality on direct and retroflexion       views. Impression:            - Diverticulosis in the entire examined colon.                        - One 4 mm polyp in the ascending colon, removed with                         a cold snare. Resected and retrieved.                        - Bleeding non-thrombosed internal hemorrhoids.                        - The examination was otherwise normal on direct and                         retroflexion views. Recommendation:        - Discharge patient to home (with escort).                        - Resume previous diet.                        - Continue present medications.                        - Await pathology  results.                        - Repeat colonoscopy is not recommended due to current                         age (31 years or older) for surveillance. Procedure Code(s):     --- Professional ---                        [phone], Colonoscopy, flexible; with removal of                         tumor(s), polyp(s), or other lesion(s) by snare                         technique Diagnosis Code(s):     --- Professional ---                         Z12.11, Encounter for screening for malignant neoplasm                         of colon                        K64.0, First degree hemorrhoids                        D12.2, Benign neoplasm of ascending colon                        K57.30, Diverticulosis of large intestine without                         perforation or abscess without bleeding CPT copyright 2022 American Medical Association. All rights reserved. The codes documented in this report are preliminary and upon coder review may  be revised to meet current compliance requirements. Wyline Mood, MD Wyline Mood MD, MD 03/15/2023 11:03:29 AM This report has been signed electronically. Number of Addenda: 0 Note Initiated On: 03/15/2023 10:19 AM Scope Withdrawal Time: 0 hours 13 minutes 34 seconds  Total Procedure Duration: 0 hours 22 minutes 3 seconds  Estimated Blood Loss:  Estimated blood loss: none.      [facility]

## 2023-03-15 NOTE — Progress Notes (Signed)
 Ascending colon polyp cold snare but not retrieved;

## 2023-03-16 ENCOUNTER — Encounter: Payer: Self-pay | Admitting: Gastroenterology

## 2023-03-16 ENCOUNTER — Telehealth: Payer: Self-pay

## 2023-03-16 NOTE — Telephone Encounter (Signed)
 Called to inform pt that her Ozempic was in the office and ready to be picked up. Okay to relay message.  Ozempic 4 boxes exp: 09/30/207 Lot: [id number]

## 2023-05-03 NOTE — Telephone Encounter (Signed)
Pt has picked up medication.  

## 2023-05-10 ENCOUNTER — Encounter: Payer: Self-pay | Admitting: Nurse Practitioner

## 2023-05-18 ENCOUNTER — Encounter: Payer: Self-pay | Admitting: Nurse Practitioner

## 2023-05-30 ENCOUNTER — Encounter: Payer: Self-pay | Admitting: Nurse Practitioner

## 2023-05-30 DIAGNOSIS — F419 Anxiety disorder, unspecified: Secondary | ICD-10-CM

## 2023-05-30 DIAGNOSIS — M25511 Pain in right shoulder: Secondary | ICD-10-CM

## 2023-05-30 DIAGNOSIS — M542 Cervicalgia: Secondary | ICD-10-CM

## 2023-05-30 DIAGNOSIS — M62838 Other muscle spasm: Secondary | ICD-10-CM

## 2023-05-31 MED ORDER — ALPRAZOLAM 0.5 MG PO TABS: 0.5000 mg | ORAL_TABLET | Freq: Every day | ORAL | 2 refills | Status: DC | PRN

## 2023-05-31 MED ORDER — CYCLOBENZAPRINE HCL 5 MG PO TABS: 5.0000 mg | ORAL_TABLET | Freq: Every day | ORAL | 5 refills | Status: AC

## 2023-06-22 ENCOUNTER — Telehealth: Payer: Self-pay | Admitting: *Deleted

## 2023-06-22 NOTE — Telephone Encounter (Signed)
 Left voicemail to notify patient that her patient assistance medication is ready for pick up.   Medication: Ozempic  0.25 pens  Quantity: 4 boxes  Lot# [id number]  Exp: 02/11/2025  Hurley Maid, CMA

## 2023-08-24 ENCOUNTER — Other Ambulatory Visit: Payer: Self-pay | Admitting: Nurse Practitioner

## 2023-08-24 DIAGNOSIS — Z1231 Encounter for screening mammogram for malignant neoplasm of breast: Secondary | ICD-10-CM

## 2023-08-30 ENCOUNTER — Ambulatory Visit
Admission: RE | Admit: 2023-08-30 | Discharge: 2023-08-30 | Disposition: A | Discharge status: Home / Self Care | Source: Ambulatory Visit | Attending: Nurse Practitioner | Admitting: Nurse Practitioner

## 2023-08-30 DIAGNOSIS — Z1231 Encounter for screening mammogram for malignant neoplasm of breast: Secondary | ICD-10-CM

## 2023-09-03 ENCOUNTER — Ambulatory Visit: Payer: Self-pay | Admitting: Nurse Practitioner

## 2023-09-07 ENCOUNTER — Telehealth: Payer: Self-pay

## 2023-09-07 NOTE — Telephone Encounter (Signed)
 NOVO NORDISK forms placed in provider to be signed folder

## 2023-09-07 NOTE — Telephone Encounter (Signed)
 Completed refill/ order form for Ozempic  (Novo Nordisk) and faxed to provider's office for review and signature.

## 2023-09-15 NOTE — Telephone Encounter (Signed)
 Form has been faxed attn Suzen Mall, CPhT to [phone] with a completed transmission log

## 2023-09-20 ENCOUNTER — Telehealth: Payer: Self-pay

## 2023-09-20 NOTE — Telephone Encounter (Signed)
 Lvm for Patient that Patient Assistance Medication are in the office & are ready for pick up.   Medication:  Ozempic   Quantity: 4 boxes  Lot# [id number]  Exp:  10/61/27

## 2023-09-28 NOTE — Telephone Encounter (Signed)
Patient picked up patient assistance medications.

## 2023-09-29 ENCOUNTER — Ambulatory Visit (INDEPENDENT_AMBULATORY_CARE_PROVIDER_SITE_OTHER): Payer: Medicare Other | Admitting: *Deleted

## 2023-09-29 VITALS — Ht 66.0 in | Wt 175.0 lb

## 2023-09-29 DIAGNOSIS — Z0000 Encounter for general adult medical examination without abnormal findings: Secondary | ICD-10-CM

## 2023-09-29 NOTE — Progress Notes (Signed)
 Subjective:   Charlene Cox is a 67 y.o. who presents for a Medicare Wellness preventive visit.  As a reminder, Annual Wellness Visits don't include a physical exam, and some assessments may be limited, especially if this visit is performed virtually. We may recommend an in-person follow-up visit with your provider if needed.  Visit Complete: Virtual I connected with  Gabriella Bunker on 09/29/23 by a audio enabled telemedicine application and verified that I am speaking with the correct person using two identifiers.  Patient Location: Home  Provider Location: Home Office  I discussed the limitations of evaluation and management by telemedicine. The patient expressed understanding and agreed to proceed.  Vital Signs: Because this visit was a virtual/telehealth visit, some criteria may be missing or patient reported. Any vitals not documented were not able to be obtained and vitals that have been documented are patient reported.  VideoDeclined- This patient declined Librarian, academic. Therefore the visit was completed with audio only.  Persons Participating in Visit: Patient.  AWV Questionnaire: No: Patient Medicare AWV questionnaire was not completed prior to this visit.  Cardiac Risk Factors include: advanced age (>4men, >38 women);dyslipidemia;hypertension     Objective:    Today's Vitals   09/29/23 1043  Weight: 175 lb (79.4 kg)  Height: 5' 6 (1.676 m)   Body mass index is 28.25 kg/m.     09/29/2023   11:03 AM 09/23/2022   11:39 AM 07/17/2021    1:31 PM 07/01/2021    2:03 PM 07/01/2021    1:30 PM 06/27/2020   12:09 PM 01/19/2019    2:28 PM  Advanced Directives  Does Patient Have a Medical Advance Directive? No No No No No No No  Does patient want to make changes to medical advance directive?   No - Patient declined Yes (MAU/Ambulatory/Procedural Areas - Information given)     Would patient like information on creating a medical advance  directive? No - Patient declined No - Patient declined  Yes (MAU/Ambulatory/Procedural Areas - Information given)   Yes (MAU/Ambulatory/Procedural Areas - Information given)    Current Medications (verified) Outpatient Encounter Medications as of 09/29/2023  Medication Sig   albuterol  (PROAIR  HFA) 108 (90 Base) MCG/ACT inhaler Inhale 1-2 puffs into the lungs every 6 (six) hours as needed for wheezing or shortness of breath.   ALPRAZolam  (XANAX ) 0.5 MG tablet Take 1 tablet (0.5 mg total) by mouth daily as needed for anxiety. For flight anxiety.   amoxicillin  (AMOXIL ) 500 MG tablet Take 2,000 mg by mouth once. Before procedure due to hx of total hip replacement   buPROPion  (WELLBUTRIN  XL) 150 MG 24 hr tablet Take 1 tablet (150 mg total) by mouth daily. (Patient taking differently: Take 150 mg by mouth daily as needed.)   cyclobenzaprine  (FLEXERIL ) 5 MG tablet Take 1 tablet (5 mg total) by mouth at bedtime. (Patient taking differently: Take 5 mg by mouth 3 times/day as needed-between meals & bedtime.)   fluticasone  (FLONASE ) 50 MCG/ACT nasal spray SPRAY 2 SPRAYS INTO EACH NOSTRIL EVERY DAY (Patient taking differently: daily as needed.)   furosemide (LASIX) 20 MG tablet Take 20 mg by mouth daily. (Patient taking differently: Take 20 mg by mouth daily as needed.)   montelukast  (SINGULAIR ) 10 MG tablet Take 1 tablet (10 mg total) by mouth at bedtime. (Patient taking differently: Take 10 mg by mouth 3 times/day as needed-between meals & bedtime.)   Semaglutide ,0.25 or 0.5MG /DOS, (OZEMPIC , 0.25 OR 0.5 MG/DOSE,) 2 MG/1.5ML SOPN Inject 0.5 mg  into the skin once a week.   traZODone  (DESYREL ) 50 MG tablet Take 1-2 tablets (50-100 mg total) by mouth at bedtime as needed. for sleep   brompheniramine-pseudoephedrine -DM 30-2-10 MG/5ML syrup Take 5-10 mLs by mouth every 6 (six) hours as needed. (Patient not taking: Reported on 09/29/2023)   No facility-administered encounter medications on file as of 09/29/2023.     Allergies (verified) Tizanidine  and Miralax [polyethylene glycol]   History: Past Medical History:  Diagnosis Date   Allergy    Anxiety    needs with flying, bloodwork   Arthritis    Chronic low back pain    COVID-19    x2   HTN (hypertension) 03/28/2016   Osteoarthritis of left hip    severe determined by x-ray   Past Surgical History:  Procedure Laterality Date   COLONOSCOPY WITH PROPOFOL  N/A 03/15/2023   Procedure: COLONOSCOPY WITH PROPOFOL ;  Surgeon: Therisa Bi, MD;  Location: [facility] ENDOSCOPY;  Service: Gastroenterology;  Laterality: N/A;   COSMETIC SURGERY  07/2018   tummy tuck and facelift   EYE SURGERY Right 06/2022   left total hip Left    08/27/21 [facility] Dr. Bertrum   POLYPECTOMY  03/15/2023   Procedure: POLYPECTOMY;  Surgeon: Therisa Bi, MD;  Location: [facility] ENDOSCOPY;  Service: Gastroenterology;;   History reviewed. No pertinent family history. Social History   Socioeconomic History   Marital status: Married    Spouse name: Not on file   Number of children: Not on file   Years of education: Not on file   Highest education level: Not on file  Occupational History   Occupation: semi retired  Tobacco Use   Smoking status: Never   Smokeless tobacco: Never  Vaping Use   Vaping status: Never Used  Substance and Sexual Activity   Alcohol use: No    Alcohol/week: 0.0 standard drinks of alcohol   Drug use: No   Sexual activity: Not Currently  Other Topics Concern   Not on file  Social History Narrative   Married   Social Drivers of Health   Financial Resource Strain: Low Risk  (09/29/2023)   Overall Financial Resource Strain (CARDIA)    Difficulty of Paying Living Expenses: Not hard at all  Food Insecurity: No Food Insecurity (09/29/2023)   Hunger Vital Sign    Worried About Running Out of Food in the Last Year: Never true    Ran Out of Food in the Last Year: Never true  Transportation Needs: No Transportation Needs (09/29/2023)   PRAPARE -  Administrator, Civil Service (Medical): No    Lack of Transportation (Non-Medical): No  Physical Activity: Inactive (09/29/2023)   Exercise Vital Sign    Days of Exercise per Week: 0 days    Minutes of Exercise per Session: 0 min  Stress: No Stress Concern Present (09/29/2023)   [employer] of Occupational Health - Occupational Stress Questionnaire    Feeling of Stress: Only a little  Social Connections: Socially Integrated (09/29/2023)   Social Connection and Isolation Panel    Frequency of Communication with Friends and Family: More than three times a week    Frequency of Social Gatherings with Friends and Family: More than three times a week    Attends Religious Services: More than 4 times per year    Active Member of [employer] or Organizations: Yes    Attends Engineer, structural: More than 4 times per year    Marital Status: Married    Tobacco  Counseling Counseling given: Not Answered    Clinical Intake:  Pre-visit preparation completed: Yes  Pain : No/denies pain     BMI - recorded: 28.25 Nutritional Status: BMI 25 -29 Overweight Nutritional Risks: None Diabetes: No  Lab Results  Component Value Date   HGBA1C 5.3 12/07/2022   HGBA1C 5.5 05/27/2022   HGBA1C 5.7 01/16/2021     How often do you need to have someone help you when you read instructions, pamphlets, or other written materials from your doctor or pharmacy?: 1 - Never  Interpreter Needed?: No  Information entered by :: R. Balin Vandegrift LPN   Activities of Daily Living     09/29/2023   10:46 AM  In your present state of health, do you have any difficulty performing the following activities:  Hearing? 1  Vision? 0  Difficulty concentrating or making decisions? 0  Walking or climbing stairs? 0  Dressing or bathing? 0  Doing errands, shopping? 0  Preparing Food and eating ? N  Using the Toilet? N  In the past six months, have you accidently leaked urine? N  Do you have problems  with loss of bowel control? N  Managing your Medications? N  Managing your Finances? N  Housekeeping or managing your Housekeeping? N    Patient Care Team: Gretel App, NP as PCP - General (Nurse Practitioner) Therisa Bi, MD as Consulting Physician (Gastroenterology) Charmayne Molly, MD as Consulting Physician (Ophthalmology)  I have updated your Care Teams any recent Medical Services you may have received from other providers in the past year.     Assessment:   This is a routine wellness examination for Charlene Cox.  Hearing/Vision screen Hearing Screening - Comments:: Wears aids at times Vision Screening - Comments:: readers   Goals Addressed             This Visit's Progress    Patient Stated       Wants to start an exercise program  Wants to learn to swim        Depression Screen     09/29/2023   10:55 AM 03/10/2023    2:02 PM 12/07/2022    3:49 PM 09/23/2022   11:31 AM 05/27/2022    4:02 PM 01/30/2022    2:01 PM 10/16/2021    2:55 PM  PHQ 2/9 Scores  PHQ - 2 Score 0 2 2 1 2 2  0  PHQ- 9 Score 0 8 6 2 7 8      Fall Risk     09/29/2023   10:49 AM 03/10/2023    2:02 PM 12/07/2022    3:38 PM 09/23/2022   11:27 AM 05/27/2022    4:02 PM  Fall Risk   Falls in the past year? 0 0 0 0 0  Number falls in past yr: 0 0 0 0 0  Injury with Fall? 0 0  0 0  Risk for fall due to : No Fall Risks No Fall Risks No Fall Risks No Fall Risks No Fall Risks  Follow up Falls evaluation completed;Falls prevention discussed Falls evaluation completed Falls evaluation completed Falls prevention discussed;Falls evaluation completed Falls evaluation completed    MEDICARE RISK AT HOME:  Medicare Risk at Home Any stairs in or around the home?: Yes If so, are there any without handrails?: No Home free of loose throw rugs in walkways, pet beds, electrical cords, etc?: Yes Adequate lighting in your home to reduce risk of falls?: Yes Life alert?: No Use of a cane, walker or w/c?:  No Grab bars  in the bathroom?: Yes Shower chair or bench in shower?: Yes Elevated toilet seat or a handicapped toilet?: Yes  TIMED UP AND GO:  Was the test performed?  No  Cognitive Function: 6CIT completed        09/29/2023   11:05 AM 09/23/2022   11:40 AM 06/27/2020   12:12 PM 01/19/2019    2:33 PM  6CIT Screen  What Year? 0 points 0 points 0 points 0 points  What month? 0 points 0 points 0 points 0 points  What time? 0 points 0 points 0 points 0 points  Count back from 20 0 points 0 points 0 points 0 points  Months in reverse 0 points 0 points 0 points 0 points  Repeat phrase 0 points 0 points 0 points 0 points  Total Score 0 points 0 points 0 points 0 points    Immunizations Immunization History  Administered Date(s) Administered   PFIZER(Purple Top)SARS-COV-2 Vaccination 02/09/2019, 03/09/2019, 11/13/2019   Pfizer Covid-19 Vaccine Bivalent Booster 52yrs & up 11/07/2020   Zoster Recombinant(Shingrix) 11/13/2019    Screening Tests Health Maintenance  Topic Date Due   DTaP/Tdap/Td (1 - Tdap) Never done   Pneumococcal Vaccine: 50+ Years (1 of 2 - PCV) Never done   Zoster Vaccines- Shingrix (2 of 2) 01/08/2020   Influenza Vaccine  Never done   COVID-19 Vaccine (5 - 2025-26 season) 09/13/2023   Medicare Annual Wellness (AWV)  09/23/2023   Mammogram  08/29/2025   Colonoscopy  03/14/2033   DEXA SCAN  Completed   Hepatitis C Screening  Completed   HPV VACCINES  Aged Out   Meningococcal B Vaccine  Aged Out    Health Maintenance Items Addressed: Patient declines vaccines at this time but may consider her second shingles vaccine.   Additional Screening:  Vision Screening: Recommended annual ophthalmology exams for early detection of glaucoma and other disorders of the eye. Is the patient up to date with their annual eye exam?  Yes  Who is the provider or what is the name of the office in which the patient attends annual eye exams? [facility]   Dental Screening:  Recommended annual dental exams for proper oral hygiene  Community Resource Referral / Chronic Care Management: CRR required this visit?  No   CCM required this visit?  No   Plan:    I have personally reviewed and noted the following in the patient's chart:   Medical and social history Use of alcohol, tobacco or illicit drugs  Current medications and supplements including opioid prescriptions. Patient is not currently taking opioid prescriptions. Functional ability and status Nutritional status Physical activity Advanced directives List of other physicians Hospitalizations, surgeries, and ER visits in previous 12 months Vitals Screenings to include cognitive, depression, and falls Referrals and appointments  In addition, I have reviewed and discussed with patient certain preventive protocols, quality metrics, and best practice recommendations. A written personalized care plan for preventive services as well as general preventive health recommendations were provided to patient.   Angeline Fredericks, LPN   [id number]   After Visit Summary: (MyChart) Due to this being a telephonic visit, the after visit summary with patients personalized plan was offered to patient via MyChart   Notes: Nothing significant to report at this time.

## 2023-09-29 NOTE — Patient Instructions (Signed)
 [name],  Thank you for taking the time for your Medicare Wellness Visit. I appreciate your continued commitment to your health goals. Please review the care plan we discussed, and feel free to reach out if I can assist you further.  Medicare recommends these wellness visits once per year to help you and your care team stay ahead of potential health issues. These visits are designed to focus on prevention, allowing your provider to concentrate on managing your acute and chronic conditions during your regular appointments.  Please note that Annual Wellness Visits do not include a physical exam. Some assessments may be limited, especially if the visit was conducted virtually. If needed, we may recommend a separate in-person follow-up with your provider.  Ongoing Care Seeing your primary care provider every 3 to 6 months helps us  monitor your health and provide consistent, personalized care.  Consider updating your vaccines  Referrals If a referral was made during today's visit and you haven't received any updates within two weeks, please contact the referred provider directly to check on the status.  Recommended Screenings:  Health Maintenance  Topic Date Due   DTaP/Tdap/Td vaccine (1 - Tdap) Never done   Pneumococcal Vaccine for age over 46 (1 of 2 - PCV) Never done   Zoster (Shingles) Vaccine (2 of 2) 01/08/2020   Flu Shot  Never done   COVID-19 Vaccine (5 - 2025-26 season) 09/13/2023   Medicare Annual Wellness Visit  09/28/2024   Breast Cancer Screening  08/29/2025   Colon Cancer Screening  03/14/2033   DEXA scan (bone density measurement)  Completed   Hepatitis C Screening  Completed   HPV Vaccine  Aged Out   Meningitis B Vaccine  Aged Out       09/29/2023   11:03 AM  Advanced Directives  Does Patient Have a Medical Advance Directive? No  Would patient like information on creating a medical advance directive? No - Patient declined   Advance Care Planning is important  because it: Ensures you receive medical care that aligns with your values, goals, and preferences. Provides guidance to your family and loved ones, reducing the emotional burden of decision-making during critical moments.  Vision: Annual vision screenings are recommended for early detection of glaucoma, cataracts, and diabetic retinopathy. These exams can also reveal signs of chronic conditions such as diabetes and high blood pressure.  Dental: Annual dental screenings help detect early signs of oral cancer, gum disease, and other conditions linked to overall health, including heart disease and diabetes.  Please see the attached documents for additional preventive care recommendations.

## 2023-10-19 ENCOUNTER — Encounter: Payer: Self-pay | Admitting: Pharmacist

## 2023-10-27 ENCOUNTER — Other Ambulatory Visit: Payer: Self-pay | Admitting: Nurse Practitioner

## 2023-12-08 ENCOUNTER — Telehealth: Payer: Self-pay

## 2023-12-08 ENCOUNTER — Ambulatory Visit: Admitting: Nurse Practitioner

## 2023-12-08 NOTE — Telephone Encounter (Signed)
 Copied from CRM [id number]. Topic: Appointments - Appointment Cancel/Reschedule >> Dec 08, 2023 11:07 AM Victoria A wrote: Patient had family emergency her nephew was missing and has been found deceased.  Patient was scheduled for an appointment today with Leron Glance, FNP-C, and called to cancel.

## 2024-01-26 ENCOUNTER — Other Ambulatory Visit: Payer: Self-pay

## 2024-01-26 DIAGNOSIS — R0981 Nasal congestion: Secondary | ICD-10-CM

## 2024-01-26 MED ORDER — FLUTICASONE PROPIONATE 50 MCG/ACT NA SUSP: NASAL | 2 refills | Status: AC

## 2024-02-09 ENCOUNTER — Telehealth: Payer: Self-pay

## 2024-02-09 ENCOUNTER — Ambulatory Visit: Admitting: Nurse Practitioner

## 2024-02-09 ENCOUNTER — Telehealth: Admitting: Nurse Practitioner

## 2024-02-09 ENCOUNTER — Other Ambulatory Visit: Payer: Self-pay | Admitting: Nurse Practitioner

## 2024-02-09 VITALS — Ht 66.0 in

## 2024-02-09 DIAGNOSIS — R7303 Prediabetes: Secondary | ICD-10-CM

## 2024-02-09 DIAGNOSIS — G4700 Insomnia, unspecified: Secondary | ICD-10-CM | POA: Diagnosis not present

## 2024-02-09 DIAGNOSIS — F419 Anxiety disorder, unspecified: Secondary | ICD-10-CM

## 2024-02-09 DIAGNOSIS — E785 Hyperlipidemia, unspecified: Secondary | ICD-10-CM | POA: Diagnosis not present

## 2024-02-09 DIAGNOSIS — D72820 Lymphocytosis (symptomatic): Secondary | ICD-10-CM

## 2024-02-09 DIAGNOSIS — L659 Nonscarring hair loss, unspecified: Secondary | ICD-10-CM

## 2024-02-09 DIAGNOSIS — J9801 Acute bronchospasm: Secondary | ICD-10-CM

## 2024-02-09 DIAGNOSIS — E559 Vitamin D deficiency, unspecified: Secondary | ICD-10-CM

## 2024-02-09 DIAGNOSIS — I10 Essential (primary) hypertension: Secondary | ICD-10-CM

## 2024-02-09 MED ORDER — ALPRAZOLAM 0.5 MG PO TABS: 0.5000 mg | ORAL_TABLET | Freq: Every day | ORAL | 1 refills | Status: AC | PRN

## 2024-02-09 MED ORDER — MINOXIDIL 2.5 MG PO TABS: 1.2500 mg | ORAL_TABLET | Freq: Every day | ORAL | 3 refills | Status: AC

## 2024-02-09 MED ORDER — ALPRAZOLAM 0.5 MG PO TABS: 0.5000 mg | ORAL_TABLET | Freq: Every day | ORAL | 1 refills | Status: DC | PRN

## 2024-02-09 MED ORDER — ALBUTEROL SULFATE HFA 108 (90 BASE) MCG/ACT IN AERS: 1.0000 | INHALATION_SPRAY | Freq: Four times a day (QID) | RESPIRATORY_TRACT | 11 refills | Status: AC | PRN

## 2024-02-09 NOTE — Telephone Encounter (Signed)
 Copied from CRM [id number]. Topic: Clinical - Prescription Issue >> Feb 09, 2024 11:56 AM Carlyon D wrote: Reason for CRM: Pt is calling in regards to her script  ALPRAZolam  (XANAX ) 0.5 MG tablet. She states [facility] is sating [facility] needs a PA now and she is upset. She is asking if this script can be sent to [facility] instead as its only 10$ out of pocket with her good rx card. If any further questions please call pt # [phone]   Preferred pharmacy: [facility] [address] [place]  [phone] Open  Closes 10 PM

## 2024-02-09 NOTE — Telephone Encounter (Signed)
 Detailed vm left informing pt that  I was giving her a call to get her started for her virtual appt on today.   Mychart also sent

## 2024-02-09 NOTE — Progress Notes (Signed)
 "   MyChart Video Visit    Virtual Visit via Video Note   This visit type was conducted because this format is felt to be most appropriate for this patient at this time. Physical exam was limited by quality of the video and audio technology used for the visit. CMA was able to get the patient set up on a video visit.  Patient location: Home. Patient and provider in visit Provider location: Office  I discussed the limitations of evaluation and management by telemedicine and the availability of in person appointments. The patient expressed understanding and agreed to proceed.  Visit Date: 02/09/2024  Today's healthcare provider: Leron Glance, NP     Subjective:    Patient ID: Charlene Cox, female    DOB: [birth year], 69 y.o.   MRN: [id number]  Chief Complaint  Patient presents with   Follow-up    HPI  Discussed the use of AI scribe software for clinical note transcription with the patient, who gave verbal consent to proceed.  History of Present Illness   Charlene Cox is a 69 year old female who presents for prescription refills and medication inquiries.  She is seeking refills for her medications, including albuterol  and Xanax . Xanax  is used primarily for flight anxiety and is taken rarely, only when she is overly stressed. She also inquired about a prescription for minoxidil , which she previously received from a dermatologist for hair retention.  She takes Wellbutrin  on an as-needed basis and has not used it in the past couple of months. She no longer takes Singulair  for allergies as she ran out and did not notice significant benefits from it. For sleep, she uses trazodone  at bedtime, which sometimes helps, but she describes herself as a 'night owl' with variable sleep patterns.  She has a history of high cholesterol and prediabetes. She previously used Ozempic  until the program providing it was discontinued.  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 She is contemplating selling her house and is planning a trip to [place] for her birthday, utilizing points and credits for travel.      Past Medical History:  Diagnosis Date   Allergy    Anxiety    needs with flying, bloodwork   Arthritis    Chronic low back pain    COVID-19    x2   HTN (hypertension) 03/28/2016   Osteoarthritis of left hip    severe determined by x-ray    Past Surgical History:  Procedure Laterality Date   COLONOSCOPY WITH PROPOFOL  N/A 03/15/2023   Procedure: COLONOSCOPY WITH PROPOFOL ;  Surgeon: Therisa Bi, MD;  Location: [facility] ENDOSCOPY;  Service: Gastroenterology;  Laterality: N/A;   COSMETIC SURGERY  07/2018   tummy tuck and facelift   EYE SURGERY Right 06/2022   left total hip Left    08/27/21 [facility] Dr. Bertrum   POLYPECTOMY  03/15/2023   Procedure: POLYPECTOMY;  Surgeon: Therisa Bi, MD;  Location: [facility] ENDOSCOPY;  Service: Gastroenterology;;    No family history on file.  Social History   Socioeconomic History   Marital status: Married    Spouse name: Not on file   Number of children: Not on file   Years of education: Not on file   Highest education level: Not on file  Occupational History   Occupation: semi retired  Tobacco Use   Smoking status: Never   Smokeless tobacco: Never  Vaping Use   Vaping status: Never Used  Substance and Sexual Activity   Alcohol use: No    Alcohol/week: 0.0 standard drinks of alcohol   Drug use: No   Sexual activity: Not Currently  Other Topics Concern   Not on file  Social History Narrative   Married   Social Drivers of Health   Tobacco Use: Low Risk (09/29/2023)   Patient History    Smoking Tobacco Use: Never    Smokeless Tobacco Use: Never    Passive Exposure: Not on file  Financial Resource Strain: Low Risk (09/29/2023)   Overall Financial Resource Strain (CARDIA)    Difficulty of Paying Living Expenses: Not hard at all  Food Insecurity: No Food Insecurity  (09/29/2023)   Epic    Worried About Programme Researcher, Broadcasting/film/video in the Last Year: Never true    Ran Out of Food in the Last Year: Never true  Transportation Needs: No Transportation Needs (09/29/2023)   Epic    Lack of Transportation (Medical): No    Lack of Transportation (Non-Medical): No  Physical Activity: Inactive (09/29/2023)   Exercise Vital Sign    Days of Exercise per Week: 0 days    Minutes of Exercise per Session: 0 min  Stress: No Stress Concern Present (09/29/2023)   [employer] of Occupational Health - Occupational Stress Questionnaire    Feeling of Stress: Only a little  Social Connections: Socially Integrated (09/29/2023)   Social Connection and Isolation Panel    Frequency of Communication with Friends and Family: More than three times a week    Frequency of Social Gatherings with Friends and Family: More than three times a week    Attends Religious Services: More than 4 times per year    Active Member of Clubs or Organizations: Yes    Attends Banker Meetings: More than 4 times per year    Marital Status: Married  Catering Manager Violence: Not At Risk (09/29/2023)   Epic    Fear of Current or Ex-Partner: No    Emotionally Abused: No    Physically Abused: No    Sexually Abused: No  Depression (PHQ2-9): Low Risk (02/09/2024)   Depression (PHQ2-9)    PHQ-2 Score: 1  Alcohol Screen: Low Risk (09/29/2023)   Alcohol Screen    Last Alcohol Screening Score (AUDIT): 1  Housing: Unknown (09/29/2023)   Epic    Unable to Pay for Housing in the Last Year: No    Number of Times Moved in the Last Year: Not on file    Homeless in the Last Year: No  Utilities: Not At Risk (09/29/2023)   Epic    Threatened with loss of utilities: No  Health Literacy: Adequate Health Literacy (09/29/2023)   B1300 Health Literacy    Frequency of need for help with medical instructions: Never    Outpatient Medications Prior to Visit  Medication Sig Dispense Refill   amoxicillin   (AMOXIL ) 500 MG tablet Take 2,000 mg by mouth once. Before procedure due to hx of total hip replacement     buPROPion  (WELLBUTRIN  XL) 150 MG 24 hr tablet Take 1 tablet (150 mg total) by mouth daily. (Patient taking differently: Take 150 mg by mouth daily as needed.) 90 tablet 3   cyclobenzaprine  (FLEXERIL ) 5 MG tablet Take 1 tablet (5 mg total) by mouth at bedtime. 30 tablet 5   fluticasone  (FLONASE ) 50 MCG/ACT nasal spray SPRAY 2 SPRAYS INTO EACH NOSTRIL EVERY DAY 48 mL 2   furosemide (LASIX) 20 MG tablet Take 20 mg by mouth  daily. (Patient taking differently: Take 20 mg by mouth daily as needed for fluid.)     traZODone  (DESYREL ) 50 MG tablet Take 1-2 tablets (50-100 mg total) by mouth at bedtime as needed. for sleep 180 tablet 2   albuterol  (PROAIR  HFA) 108 (90 Base) MCG/ACT inhaler Inhale 1-2 puffs into the lungs every 6 (six) hours as needed for wheezing or shortness of breath. 18 g 11   ALPRAZolam  (XANAX ) 0.5 MG tablet Take 1 tablet (0.5 mg total) by mouth daily as needed for anxiety. For flight anxiety. 30 tablet 2   brompheniramine-pseudoephedrine -DM 30-2-10 MG/5ML syrup Take 5-10 mLs by mouth every 6 (six) hours as needed. (Patient not taking: Reported on 09/29/2023) 120 mL 0   montelukast  (SINGULAIR ) 10 MG tablet Take 1 tablet (10 mg total) by mouth at bedtime. (Patient not taking: Reported on 02/09/2024) 90 tablet 3   Semaglutide ,0.25 or 0.5MG /DOS, (OZEMPIC , 0.25 OR 0.5 MG/DOSE,) 2 MG/1.5ML SOPN Inject 0.5 mg into the skin once a week. (Patient not taking: Reported on 02/09/2024) 1.5 mL 2   No facility-administered medications prior to visit.    Allergies[1]  ROS See HPI    Objective:    Physical Exam  Ht 5' 6 (1.676 m)   BMI 28.25 kg/m  Wt Readings from Last 3 Encounters:  09/29/23 175 lb (79.4 kg)  03/15/23 171 lb (77.6 kg)  03/10/23 167 lb (75.8 kg)   GENERAL: alert, oriented, appears well and in no acute distress   HEENT: atraumatic, conjunttiva clear, no obvious  abnormalities on inspection of external nose and ears   NECK: normal movements of the head and neck   LUNGS: on inspection no signs of respiratory distress, breathing rate appears normal, no obvious gross SOB, gasping or wheezing   CV: no obvious cyanosis   MS: moves all visible extremities without noticeable abnormality   PSYCH/NEURO: pleasant and cooperative, no obvious depression or anxiety, speech and thought processing grossly intact    Assessment & Plan:   Problem List Items Addressed This Visit       Other   Anxiety - Primary   Symptoms are adequately controlled at this time. Current medications include Xanax  0.5 mg as needed for flight anxiety and Wellbutrin  XL 150 mg daily. Continue current medications as prescribed. Refilled Xanax  prescription for an upcoming trip. Encouraged to contact if worsening symptoms, unusual behavior changes or suicidal thoughts occur. PDMP reviewed.       Relevant Medications   ALPRAZolam  (XANAX ) 0.5 MG tablet   Prediabetes   Managed with diet and exercise. No longer using semaglutide . Check A1c.       Hyperlipidemia   Managed with diet and exercise. Check fasting lipid panel.       Relevant Medications   minoxidil  (LONITEN ) 2.5 MG tablet   Insomnia   Well managed with Trazodone  50-100 mg at bedtime as needed. Continue.       Hair loss   Previously managed with compounded oral minoxidil . Prescribed regular minoxidil  2.5 mg tablets, instructed to take half a tablet daily. We will continue to monitor.       Relevant Medications   minoxidil  (LONITEN ) 2.5 MG tablet   Other Visit Diagnoses       Bronchospasm       Relevant Medications   albuterol  (PROAIR  HFA) 108 (90 Base) MCG/ACT inhaler      I have discontinued Charlene Cox's montelukast , Ozempic  (0.25 or 0.5 MG/DOSE), and brompheniramine-pseudoephedrine -DM. I am also having her start on minoxidil . Additionally, I  am having her maintain her furosemide, buPROPion , traZODone ,  amoxicillin , cyclobenzaprine , fluticasone , albuterol , and ALPRAZolam .  Meds ordered this encounter  Medications   albuterol  (PROAIR  HFA) 108 (90 Base) MCG/ACT inhaler    Sig: Inhale 1-2 puffs into the lungs every 6 (six) hours as needed for wheezing or shortness of breath.    Dispense:  18 g    Refill:  11   DISCONTD: ALPRAZolam  (XANAX ) 0.5 MG tablet    Sig: Take 1 tablet (0.5 mg total) by mouth daily as needed for anxiety. For flight anxiety.    Dispense:  30 tablet    Refill:  1   minoxidil  (LONITEN ) 2.5 MG tablet    Sig: Take 0.5 tablets (1.25 mg total) by mouth daily.    Dispense:  45 tablet    Refill:  3    Supervising Provider:   TULLO, TERESA L [[id number]]   ALPRAZolam  (XANAX ) 0.5 MG tablet    Sig: Take 1 tablet (0.5 mg total) by mouth daily as needed for anxiety. For flight anxiety.    Dispense:  30 tablet    Refill:  1    Supervising Provider:   MARYLYNN VERNEITA CROME [[id number]]    I discussed the assessment and treatment plan with the patient. The patient was provided an opportunity to ask questions and all were answered. The patient agreed with the plan and demonstrated an understanding of the instructions.   The patient was advised to call back or seek an in-person evaluation if the symptoms worsen or if the condition fails to improve as anticipated.   Leron Glance, NP [facility] [phone] (phone) [phone] (fax)  [facility]      [1]  Allergies Allergen Reactions   Latex    Tizanidine  Other (See Comments)    ...after 1 pill of that muscle relaxer about 4 hours later when I got up to pee there was a discomfort it seemed with my urinary track almost like a serious UTI.  I took Tylenol  and it began to subside...it had to be my reaction to the muscle relaxer..thanks so much. Teresa   Miralax [Polyethylene Glycol (Macrogol)] Other (See Comments)    ?reaction stomach cramp per patient.      "

## 2024-02-09 NOTE — Telephone Encounter (Signed)
 Noted.

## 2024-02-10 ENCOUNTER — Other Ambulatory Visit (HOSPITAL_COMMUNITY): Payer: Self-pay

## 2024-02-10 ENCOUNTER — Telehealth: Payer: Self-pay

## 2024-02-10 NOTE — Telephone Encounter (Signed)
 Pharmacy Patient Advocate Encounter   Received notification from Physician's Office that prior authorization for ALPRAZolam  0.5MG  tablets is required/requested.   Insurance verification completed.   The patient is insured through [facility]   Per test claim: PA required; PA submitted to above mentioned insurance via Latent Key/confirmation #/EOC [id number] Status is pending

## 2024-02-14 ENCOUNTER — Other Ambulatory Visit (HOSPITAL_COMMUNITY): Payer: Self-pay

## 2024-02-16 ENCOUNTER — Encounter: Payer: Self-pay | Admitting: Nurse Practitioner

## 2024-02-16 DIAGNOSIS — L659 Nonscarring hair loss, unspecified: Secondary | ICD-10-CM | POA: Insufficient documentation

## 2024-02-16 NOTE — Assessment & Plan Note (Signed)
 Managed with diet and exercise. No longer using semaglutide . Check A1c.

## 2024-02-16 NOTE — Assessment & Plan Note (Signed)
 Managed with diet and exercise. Check fasting lipid panel.

## 2024-02-16 NOTE — Assessment & Plan Note (Signed)
 Symptoms are adequately controlled at this time. Current medications include Xanax  0.5 mg as needed for flight anxiety and Wellbutrin  XL 150 mg daily. Continue current medications as prescribed. Refilled Xanax  prescription for an upcoming trip. Encouraged to contact if worsening symptoms, unusual behavior changes or suicidal thoughts occur. PDMP reviewed.

## 2024-02-16 NOTE — Assessment & Plan Note (Signed)
 Previously managed with compounded oral minoxidil . Prescribed regular minoxidil  2.5 mg tablets, instructed to take half a tablet daily. We will continue to monitor.

## 2024-02-16 NOTE — Assessment & Plan Note (Signed)
 Well managed with Trazodone  50-100 mg at bedtime as needed. Continue.

## 2024-10-02 ENCOUNTER — Ambulatory Visit
